# Patient Record
Sex: Male | Born: 1990
Health system: Southern US, Community
[De-identification: ages and names within clinical notes are randomized; demographics above are authoritative.]

## PROBLEM LIST (undated history)

## (undated) DIAGNOSIS — Z8711 Personal history of peptic ulcer disease: Secondary | ICD-10-CM

## (undated) DIAGNOSIS — Z8719 Personal history of other diseases of the digestive system: Secondary | ICD-10-CM

## (undated) DIAGNOSIS — R413 Other amnesia: Secondary | ICD-10-CM

## (undated) DIAGNOSIS — L723 Sebaceous cyst: Secondary | ICD-10-CM

## (undated) DIAGNOSIS — F319 Bipolar disorder, unspecified: Secondary | ICD-10-CM

## (undated) HISTORY — PX: NO PAST SURGERIES: SHX2092

---
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
## 2008-03-29 ENCOUNTER — Ambulatory Visit: Payer: Self-pay | Admitting: Family Medicine

## 2008-05-16 ENCOUNTER — Emergency Department (HOSPITAL_COMMUNITY): Admission: EM | Admit: 2008-05-16 | Discharge: 2008-05-16 | Payer: Self-pay | Admitting: Emergency Medicine

## 2008-08-25 ENCOUNTER — Telehealth: Payer: Self-pay | Admitting: Family Medicine

## 2008-09-19 ENCOUNTER — Ambulatory Visit: Payer: Self-pay | Admitting: Family Medicine

## 2008-09-19 DIAGNOSIS — Z720 Tobacco use: Secondary | ICD-10-CM

## 2008-09-19 DIAGNOSIS — F319 Bipolar disorder, unspecified: Secondary | ICD-10-CM | POA: Insufficient documentation

## 2008-09-19 DIAGNOSIS — H538 Other visual disturbances: Secondary | ICD-10-CM

## 2009-07-02 ENCOUNTER — Ambulatory Visit: Payer: Self-pay | Admitting: Family Medicine

## 2009-07-02 DIAGNOSIS — L708 Other acne: Secondary | ICD-10-CM

## 2009-07-02 DIAGNOSIS — T7840XA Allergy, unspecified, initial encounter: Secondary | ICD-10-CM | POA: Insufficient documentation

## 2009-08-08 ENCOUNTER — Emergency Department (HOSPITAL_COMMUNITY): Admission: EM | Admit: 2009-08-08 | Discharge: 2009-08-09 | Payer: Self-pay | Admitting: Emergency Medicine

## 2010-04-01 ENCOUNTER — Telehealth: Payer: Self-pay | Admitting: Sports Medicine

## 2010-04-24 ENCOUNTER — Ambulatory Visit: Payer: Self-pay | Admitting: Family Medicine

## 2010-04-24 ENCOUNTER — Encounter: Payer: Self-pay | Admitting: Family Medicine

## 2010-04-24 DIAGNOSIS — N342 Other urethritis: Secondary | ICD-10-CM | POA: Insufficient documentation

## 2010-04-24 LAB — CONVERTED CEMR LAB
BUN: 12 mg/dL (ref 6–23)
Calcium: 9.6 mg/dL (ref 8.4–10.5)
Chlamydia, Swab/Urine, PCR: NEGATIVE
Glucose, Bld: 79 mg/dL (ref 70–99)
Hemoglobin: 14.8 g/dL (ref 13.0–17.0)
MCHC: 33.3 g/dL (ref 30.0–36.0)
Nitrite: NEGATIVE
Potassium: 4.4 meq/L (ref 3.5–5.3)
RDW: 14.6 % (ref 11.5–15.5)
Urobilinogen, UA: 0.2
WBC Urine, dipstick: NEGATIVE

## 2010-04-25 ENCOUNTER — Encounter: Payer: Self-pay | Admitting: Family Medicine

## 2010-04-29 ENCOUNTER — Telehealth: Payer: Self-pay | Admitting: Family Medicine

## 2010-05-06 ENCOUNTER — Encounter: Payer: Self-pay | Admitting: Family Medicine

## 2010-06-13 ENCOUNTER — Emergency Department (HOSPITAL_COMMUNITY)
Admission: EM | Admit: 2010-06-13 | Discharge: 2010-06-13 | Payer: Self-pay | Source: Home / Self Care | Admitting: Emergency Medicine

## 2010-06-20 ENCOUNTER — Emergency Department (HOSPITAL_COMMUNITY)
Admission: EM | Admit: 2010-06-20 | Discharge: 2010-06-20 | Payer: Self-pay | Source: Home / Self Care | Admitting: Emergency Medicine

## 2010-06-24 ENCOUNTER — Emergency Department (HOSPITAL_COMMUNITY)
Admission: EM | Admit: 2010-06-24 | Discharge: 2010-06-24 | Payer: Self-pay | Source: Home / Self Care | Admitting: Emergency Medicine

## 2010-06-30 ENCOUNTER — Emergency Department (HOSPITAL_COMMUNITY)
Admission: EM | Admit: 2010-06-30 | Discharge: 2010-06-30 | Payer: Self-pay | Source: Home / Self Care | Admitting: Emergency Medicine

## 2010-07-30 NOTE — Progress Notes (Signed)
Summary: results  Phone Note Call from Patient Call back at Home Phone [phone]   Caller: Patient Summary of Call: Patient requesting results of labs.  Please call back at earliest convenience std results Initial call taken by: Abundio Miu,  April 29, 2010 3:09 PM  Follow-up for Phone Call        Attempted to call patient. Letter already sent with all lab results. Message left advising patient to pass along lab results for CBC, CMET, TSH to psychiatry.  Follow-up by: Bobby Rumpf  MD,  May 01, 2010 2:32 PM

## 2010-07-30 NOTE — Progress Notes (Signed)
Summary: Rx Req  Phone Note Refill Request Call back at [phone] Message from:  MOM-CYNTHIA Aki  Refills Requested: Medication #1:  BENZACLIN WITH PUMP 1-5 % GEL Apply to affected areas on face two times a day. [facility] [facility].  Initial call taken by: Clydell Hakim,  April 01, 2010 9:55 AM  Follow-up for Phone Call        Notified rx ready for pick-up. Follow-up by: Clydell Hakim,  April 01, 2010 2:10 PM    Prescriptions: BENZACLIN WITH PUMP 1-5 % GEL (CLINDAMYCIN PHOS-BENZOYL PEROX) Apply to affected areas on face two times a day  #1 jar x 6   Entered and Authorized by:   Rodney Langton MD   Signed by:   Rodney Langton MD on 04/01/2010   Method used:   Electronically to        [facility] [id number] (retail)       [address] [place], [place]  [address]       Ph: [phone]       Fax: [phone]   RxID:   [id number]

## 2010-07-30 NOTE — Assessment & Plan Note (Signed)
Summary: ck his eye,tcb   Vital Signs:  Patient profile:   20 year old male Height:      72.0 inches Weight:      204.7 pounds BMI:     282.41 Temp:     97.9 degrees F oral Pulse rate:   93 / minute BP sitting:   132 / 77  (left arm) Cuff size:   regular  Vitals Entered By: Gladstone Pih (July 02, 2009 11:18 AM) CC: blurry vision left eye mostly at night Is Patient Diabetic? No Pain Assessment Patient in pain? no       Vision Screening:Left eye w/o correction: 20 / 20 Right Eye w/o correction: 20 / 20 Both eyes w/o correction:  20/ 20        Vision Entered By: Gladstone Pih (July 02, 2009 11:23 AM)   Primary Care Provider:  Ancil Boozer  MD  CC:  blurry vision left eye mostly at night.  History of Present Illness: Eyes:  1 month itchy, watery, both eyes, worse at night, worse when people are cooking in the kitchen, also associated with runny nose.  Some blurriness noted but visual acuity normal.  Had used some of sister's patanol and noted improvement in symptoms.  Referral to ophtho at last visit, has appt in Feb.  No eye pain, no visual field defects, no redness, no HA.  Face:  had an early abscess at last visit, warm compresses helped, still present.  on both cheeks, only mildly painful.  Had been using sister's Elidel without consulting with an MD.  Habits & Providers  Alcohol-Tobacco-Diet     Tobacco Status: current     Tobacco Counseling: to quit use of tobacco products     Cigarette Packs/Day: <0.25  Current Medications (verified): 1)  Lithium Carbonate 300 Mg Caps (Lithium Carbonate) .Marland Kitchen.. 1 By Mouth Qam and 2 By Mouth At Bedtime For Bipolar 2)  Risperidone 2 Mg Tabs (Risperidone) .Marland Kitchen.. 1 By Mouth At Bedtime For Bipolar 3)  Trazodone Hcl 50 Mg Tabs (Trazodone Hcl) .Marland Kitchen.. 1 By Mouth At Bedtime Prn For Insomnia 4)  Depakote Er 500 Mg Xr24h-Tab (Divalproex Sodium) .Marland Kitchen.. 1 By Mouth At Bedtime For Bipolar 5)  Patanol 0.1 % Soln (Olopatadine Hcl) .... 2 Drops  in Each Eye Bid 6)  Loratadine 10 Mg Tabs (Loratadine) .... One Tab By Mouth Daily 7)  Benzaclin With Pump 1-5 % Gel (Clindamycin Phos-Benzoyl Perox) .... Apply To Affected Areas On Face Two Times A Day  Allergies (verified): No Known Drug Allergies  Social History: Smoking Status:  current Packs/Day:  <0.25  Review of Systems       See HPI  Physical Exam  General:  Well-developed,well-nourished,in no acute distress; alert,appropriate and cooperative throughout examination Eyes:  No corneal or conjunctival inflammation noted. EOMI. Perrla. Skin:  Several comedones in various stages of healing noted on face.  No signs cellulitis or abscess at this time, no cervical LAD.     Impression & Recommendations:  Problem # 1:  ALLERGY, ENVIRONMENTAL (ICD-995.3) Assessment New Itchy, watery eyes +/- blurriness likely 2/2 environmental allergies (to ingredients used in cooking).  Will rx patanol as well as oral loratadine.  Pt to let us know if symptoms improve.  If they do then allergic etiology likely.  Advised avoidance/staying out of room when cooking.  Can try other topical agents such as azelastine, topical NSAIDS, or topical vasoconstrictors/decongestants if no improvement.  Keep appt with ophtho.  Orders: [facility]- Est  Level 4 (99214)  Problem # 2:  ACNE VULGARIS, FACIAL (ICD-706.1) Assessment: New Advised to stop using sister's Elidel (pimecrolimus).  Will try the below medication, pt to also avoid close shaves as there is some pseudofolliculitis barbae present.  Can use trimmers as long as they are clean and don't cut close to the skin.  If no improvement can try topical retinoids, then oral agents (minocycline, doxycycline), then oral retinoids if still no improvement.  Also can consider corticosteroid injection into comedones.  His updated medication list for this problem includes:    Benzaclin With Pump 1-5 % Gel (Clindamycin phos-benzoyl perox) .Marland Kitchen... Apply to affected areas on face  two times a day  Orders: [facility]- Est  Level 4 (81191)  Complete Medication List: 1)  Lithium Carbonate 300 Mg Caps (Lithium carbonate) .Marland Kitchen.. 1 by mouth qam and 2 by mouth at bedtime for bipolar 2)  Risperidone 2 Mg Tabs (Risperidone) .Marland Kitchen.. 1 by mouth at bedtime for bipolar 3)  Trazodone Hcl 50 Mg Tabs (Trazodone hcl) .Marland Kitchen.. 1 by mouth at bedtime prn for insomnia 4)  Depakote Er 500 Mg Xr24h-tab (Divalproex sodium) .Marland Kitchen.. 1 by mouth at bedtime for bipolar 5)  Patanol 0.1 % Soln (Olopatadine hcl) .... 2 drops in each eye bid 6)  Loratadine 10 Mg Tabs (Loratadine) .... One tab by mouth daily 7)  Benzaclin With Pump 1-5 % Gel (Clindamycin phos-benzoyl perox) .... Apply to affected areas on face two times a day  Other Orders: Influenza Vaccine NON MCR ([address])  Patient Instructions: 1)  Good to meet you today, 2)  use the patanol two times a day, use the loratadine daily, apply the Benzaclin two times a day. 3)  Keep your appt with the eye doctor. 4)  -Dr. Karie Schwalbe. Prescriptions: BENZACLIN WITH PUMP 1-5 % GEL (CLINDAMYCIN PHOS-BENZOYL PEROX) Apply to affected areas on face two times a day  #1 jar x 0   Entered and Authorized by:   Rodney Langton MD   Signed by:   Rodney Langton MD on 07/02/2009   Method used:   Electronically to        [facility] [address] (retail)       [address]       [place], [place]  [address]       Ph: [phone]       Fax: [phone]   RxID:   [id number] LORATADINE 10 MG TABS (LORATADINE) One tab by mouth daily  #90 x 0   Entered and Authorized by:   Rodney Langton MD   Signed by:   Rodney Langton MD on 07/02/2009   Method used:   Electronically to        [facility] [address] (retail)       [address] [place], [place]  [address]       Ph: [phone]       Fax: [phone]   RxID:   [id number] PATANOL 0.1 % SOLN (OLOPATADINE HCL) 2 drops in each eye BID  #1 bottle x 0    Entered and Authorized by:   Rodney Langton MD   Signed by:   Rodney Langton MD on 07/02/2009   Method used:   Electronically to        [facility] [address] (retail)       [address]       [place], [place]  [address]       Ph: [phone]       Fax: [phone]   RxID:   [id number]       Immunizations Administered:  Influenza Vaccine # 1:    Vaccine Type: Fluvax Non-MCR    Site: left deltoid    Mfr: GlaxoSmithKline    Dose: 0.5 ml    Route: IM    Given by: Loralee Pacas CMA    Exp. Date: 12/27/2009    Lot #: [id number]    VIS given: 04/06/2009  Flu Vaccine Consent Questions:    Do you have a history of severe allergic reactions to this vaccine? no    Any prior history of allergic reactions to egg and/or gelatin? no    Do you have a sensitivity to the preservative Thimersol? no    Do you have a past history of Guillan-Barre Syndrome? no    Do you currently have an acute febrile illness? no    Have you ever had a severe reaction to latex? no    Vaccine information given and explained to patient? yes

## 2010-07-30 NOTE — Assessment & Plan Note (Signed)
Summary: cpe/eo   Vital Signs:  Patient profile:   20 year old male Height:      73 inches Weight:      201 pounds BMI:     26.61 Temp:     98.7 degrees F oral Pulse rate:   90 / minute BP sitting:   149 / 79  (right arm) Cuff size:   large  Vitals Entered By: Jimmy Footman, CMA (April 24, 2010 10:16 AM) CC: STD testing  Is Patient Diabetic? No Pain Assessment Patient in pain? no        Primary Care Provider:  . [facility]  CC:  STD testing .  History of Present Illness: 1) STD testing: Unprotected intercourse 2 weeks ago - now notes some intermittent tingling sensation and dysuria. Would like to be tested for STDs.   2) Bipolar disorder: On medications as below without side effects. Reports that he is seen twice a month at [facility] Reports that he is supposed to have lab work done, but has not been checked with  lab work for about a year. Reports that his mood is "stable". Denies SI/HI.  ROS: Denies weight loss, fever, chills, lesions, lymphadenopathy, cough, joint pain, rash, oral lesions, nausea or vomiting, hematuria, increased or decreased urinary frequency, myalgias  Habits & Providers  Alcohol-Tobacco-Diet     Tobacco Status: current  Exercise-Depression-Behavior     Does Patient Exercise: yes     Have you felt down or hopeless? no     Have you felt little pleasure in things? no     Depression Counseling: not indicated; screening negative for depression  Current Medications (verified): 1)  Lithium Carbonate 300 Mg Caps (Lithium Carbonate) .Marland Kitchen.. 1 By Mouth Qam and 2 By Mouth At Bedtime For Bipolar 2)  Risperidone 2 Mg Tabs (Risperidone) .Marland Kitchen.. 1 By Mouth At Bedtime For Bipolar 3)  Trazodone Hcl 50 Mg Tabs (Trazodone Hcl) .Marland Kitchen.. 1 By Mouth At Bedtime Prn For Insomnia 4)  Depakote Er 500 Mg Xr24h-Tab (Divalproex Sodium) .Marland Kitchen.. 1 By Mouth At Bedtime For Bipolar 5)  Patanol 0.1 % Soln (Olopatadine Hcl) .... 2 Drops in Each Eye Bid 6)  Loratadine 10 Mg Tabs  (Loratadine) .... One Tab By Mouth Daily 7)  Benzaclin With Pump 1-5 % Gel (Clindamycin Phos-Benzoyl Perox) .... Apply To Affected Areas On Face Two Times A Day  Allergies (verified): No Known Drug Allergies  Social History: Does Patient Exercise:  yes  Physical Exam  General:  Well-developed,well-nourished,in no acute distress; alert,appropriate and cooperative throughout examination Mouth:  no oral lesions  Neck:  no thyromegaly  Lungs:  normal respiratory effort, normal breath sounds, and no wheezes.   Heart:  normal rate, regular rhythm, and no murmur.   Genitalia:  uncircumcised, no scrotal masses, no testicular masses or atrophy, no cutaneous lesions, and no urethral discharge.   Inguinal Nodes:  no R inguinal adenopathy and no L inguinal adenopathy.     Impression & Recommendations:  Problem # 1:  URETHRITIS (ICD-597.80) Normal exam. UA negative. Will check GC/CT (urine).  Orders: Urinalysis-[facility] (00000) GC/Chlamydia-[facility] (87591/87491) [facility]- Est  Level 4 (99214)  Problem # 2:  CONTACT OR EXPOSURE TO OTHER VIRAL DISEASES (ICD-V01.79) No symptoms. Will check HIV, RPR. Advised on safe sexual practices.  Orders: HIV-[facility] (47425-95638) [facility]- Est  Level 4 (75643)  Problem # 3:  BIPOLAR AFFECTIVE DISORDER (ICD-296.80) Assessment: Unchanged Will check BMET and CBC with chronic lithium therapy and reports of no labs in past year.  Unable to check trough as pateint took medications this AM. Plan in place by [facility] for patient to have lab check as well (per his report) "soon". Will forward lab results when available. Stable mood. No SI/HI.   Orders: Basic Met-[facility] (59563-87564) CBC-[facility] (33295) [facility]- Est  Level 4 (18841)  Complete Medication List: 1)  Lithium Carbonate 300 Mg Caps (Lithium carbonate) .Marland Kitchen.. 1 by mouth qam and 2 by mouth at bedtime for bipolar 2)  Risperidone 2 Mg Tabs (Risperidone) .Marland Kitchen.. 1 by mouth at bedtime for bipolar 3)  Trazodone Hcl 50 Mg Tabs  (Trazodone hcl) .Marland Kitchen.. 1 by mouth at bedtime prn for insomnia 4)  Depakote Er 500 Mg Xr24h-tab (Divalproex sodium) .Marland Kitchen.. 1 by mouth at bedtime for bipolar 5)  Patanol 0.1 % Soln (Olopatadine hcl) .... 2 drops in each eye bid 6)  Loratadine 10 Mg Tabs (Loratadine) .... One tab by mouth daily 7)  Benzaclin With Pump 1-5 % Gel (Clindamycin phos-benzoyl perox) .... Apply to affected areas on face two times a day  Other Orders: Influenza Vaccine NON MCR (66063) RPR-[facility] [phone])  Patient Instructions: 1)  Use protection when you have sexual intercourse 2)  Follow up with your psychiatrist - I am going to check lab work on you today and will mail a copy of it to give to them    Orders Added: 1)  Basic Met-[facility] [55732-20254] 2)  CBC-[facility] [85027] 3)  Urinalysis-[facility] [00000] 4)  GC/Chlamydia-[facility] [87591/87491] 5)  HIV-[facility] [27062-37628] 6)  Influenza Vaccine NON MCR [00028] 7)  RPR-[facility] [31517-61607] 8)  [facility]- Est  Level 4 [37106]   Immunizations Administered:  Influenza Vaccine # 1:    Vaccine Type: Fluvax Non-MCR    Site: left deltoid    Mfr: GlaxoSmithKline    Dose: 0.5 ml    Route: IM    Given by: Tessie Fass CMA    Exp. Date: 12/25/2010    Lot #: [id number]    VIS given: 01/22/10 version given April 24, 2010.  Flu Vaccine Consent Questions:    Do you have a history of severe allergic reactions to this vaccine? no    Any prior history of allergic reactions to egg and/or gelatin? no    Do you have a sensitivity to the preservative Thimersol? no    Do you have a past history of Guillan-Barre Syndrome? no    Do you currently have an acute febrile illness? no    Have you ever had a severe reaction to latex? no    Vaccine information given and explained to patient? yes   Immunizations Administered:  Influenza Vaccine # 1:    Vaccine Type: Fluvax Non-MCR    Site: left deltoid    Mfr: GlaxoSmithKline    Dose: 0.5 ml    Route: IM    Given by: Tessie Fass CMA    Exp. Date:  12/25/2010    Lot #: [id number]    VIS given: 01/22/10 version given April 24, 2010.  Laboratory Results   Urine Tests  Date/Time Received: April 24, 2010 10:51 AM  Date/Time Reported: April 24, 2010 11:20 AM   Routine Urinalysis   Color: yellow Appearance: Clear Glucose: negative   (Normal Range: Negative) Bilirubin: negative   (Normal Range: Negative) Ketone: negative   (Normal Range: Negative) Spec. Gravity: 1.015   (Normal Range: 1.003-1.035) Blood: negative   (Normal Range: Negative) pH: 7.0   (Normal Range: 5.0-8.0) Protein: negative   (Normal Range: Negative) Urobilinogen: 0.2   (Normal Range:  0-1) Nitrite: negative   (Normal Range: Negative) Leukocyte Esterace: negative   (Normal Range: Negative)    Comments: .........Marland Kitchenbiochemical negative; microscopic not indicated ...............test performed by......Marland KitchenBonnie A. [place], MLS (ASCP)cm      Appended Document: Orders Update    Clinical Lists Changes  Orders: Added new Test order of TSH-[facility] [phone]) - Signed

## 2010-07-30 NOTE — Miscellaneous (Signed)
Summary: ROI  ROI   Imported By: Knox Royalty 05/15/2010 10:17:07  _____________________________________________________________________  External Attachment:    Type:   Image     Comment:   External Document

## 2010-09-09 LAB — URINALYSIS, ROUTINE W REFLEX MICROSCOPIC
Hgb urine dipstick: NEGATIVE
Specific Gravity, Urine: 1.025 (ref 1.005–1.030)
Urobilinogen, UA: 1 mg/dL (ref 0.0–1.0)
pH: 6 (ref 5.0–8.0)

## 2010-09-09 LAB — URINE MICROSCOPIC-ADD ON

## 2010-09-09 LAB — GC/CHLAMYDIA PROBE AMP, GENITAL: Chlamydia, DNA Probe: NEGATIVE
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
## 2010-11-15 NOTE — Discharge Summary (Signed)
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  The patient was  dropped off at the front lobby with no adult accompanying him.  The  family attributes the patient's 3 weeks of increasing paranoid delusions  to tainted cannabis in the community.  The patient suggests he has used  cannabis for several years and that he has not smoked again for the left  3 weeks.  The patient describes longstanding and more recent mental  health issues but he is not clear or decisive in his reporting as though  he is paranoid and depressively and anxiously inhibited.  For full  details please see the typed admission assessment.   SYNOPSIS OF PRESENT ILLNESS:  The patient resides with mother, sister  and brother with mother apparently having mental health difficulties  that she does not discuss with any of the family.  Mother therefore  maintains that the rest of the family does not know about her problems,  but the patient indicates that he has been attempting to talk to mother  about her symptoms and the meaning for his symptoms all of his life.  Mother feels that part of the patient's behavior is genetic and there is  a cousin that died at age 61 of a drug overdose to whom the patient was  close.  The patient is artistic but currently finding difficulty  functioning in his academic productivity.  He is becoming more stressed  and depressed.   Mother considers herself to have anxiety and  schizophrenia and bipolar disorder treated with Abilify and Depakote.  Maternal uncle has bipolar depression and maternal grandmother's on  Xanax for anxiety.  Two cousins have anger management problems and a  maternal uncle has ADHD and anxiety.  Maternal great-grandmother had  anxiety and sister has learning this disorder.  There is some family  history of substance abuse especially alcohol and some drugs.  The  patient is fixated on his bowels and bladder at the time of admission  but will not explain his symptoms other than saying something is wrong.  He suggests that he has burning pain in his chest to as well.  He seems  to indicate vague auditory hallucinations but seems constricted by more  chronic persecutory delusions including regarding communication and  relations with mother.  The father figure in his life is more confident  and provides more security for the patient.  His history of cardiac  murmur was apparently some type of septal defect that resolved, although  one of his chest x-rays was labeled as hypertrophic cardiomyopathy.  The  patient acknowledges sexual activity.  He was one of the candidates for  homecoming king.  Mother suggests that he is quite popular at school  with the patient stating he uses cannabis to fit in with peer pressure.   INITIAL MENTAL STATUS EXAM:  Neurological exam was intact and he is  right-handed.  He is hesitant and inhibited even to talk about his  worries, perceptual problems and depression.  He has severe dysphoria  with melancholic involution.  He has persecutory delusions that are  difficult to fully outline but did not appear readily fixed although he  will not discuss them.  He appears to have projective identification of  symptoms from mother as well as shared symptoms.  He is disorganized and  eccentric at times although well liked and accepted by peers.  He has  suicidal ideation as  though thinking he may be the cause of mother's  problems.   LABORATORY FINDINGS:  CBC was normal with white count 4100, hemoglobin  15.4, MCV of 81 and platelet count 369,000.  Basic metabolic panel was  normal with sodium 135, potassium 4.4, fasting glucose 90, creatinine  1.0, and calcium 10.2.  Hepatic function panel was normal with total  bilirubin 0.8, albumin 4.1, AST 16, ALT 21 and GGT 21.  Free T4 was  normal at 1.18 and TSH at 2.226.  Urinalysis was normal with specific  gravity of 1.025 and pH of one.  Urine drug screen was negative with  creatinine of 259 mg/dL documenting adequate specimen.  RPR was  nonreactive and urine probe for gonorrhea and chlamydia by DNA  amplification were both negative.  Electrocardiogram performed on 5 mg  of Abilify nightly with p.r.n. of 2 mg available was normal sinus rhythm  with sinus arrhythmia, normal EKG with rate of 71, PR of 150, QRS of 84  and QTC 393 milliseconds.  This was not yet confirmed by cardiology.   HOSPITAL COURSE AND TREATMENT:  General medical exam by Jorje Guild PA-C  noted that he may have had hospitalization for kidney problems within  the last year.  The patient seemed to be describing constipation as a  source of his abdominal and genital as well as chest complaints.  He has  no medication allergies.  The patient does have sleep difficulties and  thinks he has lost weight.  He has frequent crying spells.  He reports  sexual activity.  His exam was otherwise intact.  He received Colace 100  mg b.i.d. throughout the hospital stay.  His perseverative fixation upon  something wrong in the bowels or genitals gradually resided.  He was  afebrile throughout the hospital stay with maximum temperature 98.3.  Initial supine blood pressure was 123/70 with heart rate of 63 and  standing blood pressure 120/75 with heart rate of 86.  At the time of  discharge, supine blood pressure was 103/59 with heart rate of 73 and  standing blood  pressure 102/68 with heart rate of 65.  The patient's  Abilify was gradually set using titrated h.s. dose as well as p.r.n.s.  He ultimately had a few side effects with most efficacy on 7.5 mg every  bedtime.  He did require Ambien at sleep 10 mg.  He had no  extrapyramidal side effects or other specific side effects.  He may have  been too drowsy on 10 mg of Abilify at bedtime.  The patient was  hesitant about asking for his Ambien on a p.r.n. basis.  By the time of  discharge he was taking both Abilify and Ambien on a scheduled basis..  The patient had no extrapyramidal or akathisia-type side effects.  He  had no side effects from Ambien including no suicide related side  effects.  Over the clinical course of treatment, the best explanation  for his longstanding and more acute symptoms appeared to be the  possibility of a delusional disorder, complicating generalized anxiety  and now psychotic depression.  The patient required no seclusion or  restraint during hospital stay.   FINAL DIAGNOSES:  AXIS I:  1. Major depression single episode severe with melancholic and      psychotic features.  2. Generalized anxiety disorder.  3. Rule out delusional disorder, persecutory type (provisional      diagnosis).  4. Cannabis abuse.  5. Parent child problem.  6. Other specified family circumstances.  7. Other interpersonal problem  AXIS II:  1. Rule out schizotypal personality (provisional diagnosis).  AXIS III:  1. Constipation.  2. History of cardiac murmur apparently septal defect that closed  3. Cannabis smoking  AXIS IV: Stressors family severe acute and chronic; phase of life severe  acute and chronic; school moderate acute and chronic  AXIS V: GAF on admission was 29 with highest in last year 55 and  discharge GAF was 50.   PLAN:  In discussing the options for Celexa during the hospital course,  we maintained Abilify.  He follows a regular diet and has no  restrictions on  physical activity.  Crisis and safety plans are outlined  if needed.  He requires no wound care or pain management.  He is  discharged on the following  1. Abilify 15 mg tablets take 1/2 tablet or 7.5 mg every bedtime      quantity #15 prescribed.  2. Ambien 10 mg every bedtime quantity #30 with no refill prescribed.      They are educated on medication      including FDA guidelines and warnings.  The patient will see Lewis Moccasin Ph.D. August 26, 2007 at 11:30 for therapy at [phone].      He will see Dr. Yvetta Coder at [phone] on February 24 at 11:30 for      psychiatric follow-up.  He has no auditory hallucinations,      homicidal ideation or suicidal ideation at the time of discharge.      Lalla Brothers, MD  Electronically Signed     GEJ/MEDQ  D:  08/27/2007  T:  08/28/2007  Job:  [phone]

## 2011-03-21 LAB — GC/CHLAMYDIA PROBE AMP, URINE
Chlamydia, Swab/Urine, PCR: NEGATIVE
GC Probe Amp, Urine: NEGATIVE

## 2011-03-21 LAB — HEPATIC FUNCTION PANEL
ALT: 21
Albumin: 4.1
Alkaline Phosphatase: 73
Total Protein: 7.2

## 2011-03-21 LAB — URINALYSIS, ROUTINE W REFLEX MICROSCOPIC
Glucose, UA: NEGATIVE
Ketones, ur: NEGATIVE
pH: 6

## 2011-03-21 LAB — CBC
Hemoglobin: 15.4
RBC: 5.71
WBC: 4.1

## 2011-03-21 LAB — DIFFERENTIAL
Lymphocytes Relative: 33
Lymphs Abs: 1.4
Monocytes Absolute: 0.2
Monocytes Relative: 5
Neutro Abs: 2.4

## 2011-03-21 LAB — BASIC METABOLIC PANEL
CO2: 26
Calcium: 10.2
GFR calc Af Amer: >60
GFR calc non Af Amer: >60
Sodium: 135

## 2011-03-21 LAB — DRUGS OF ABUSE SCREEN W/O ALC, ROUTINE URINE
Amphetamine Screen, Ur: NEGATIVE
Barbiturate Quant, Ur: NEGATIVE
Phencyclidine (PCP): NEGATIVE

## 2011-03-21 LAB — GAMMA GT: GGT: 21

## 2011-04-01 LAB — CBC
Hemoglobin: 17.4 — ABNORMAL HIGH
MCHC: 32.2
MCV: 82.8
RBC: 6.5 — ABNORMAL HIGH
WBC: 3.9 — ABNORMAL LOW

## 2011-04-01 LAB — RAPID URINE DRUG SCREEN, HOSP PERFORMED
Barbiturates: NOT DETECTED
Benzodiazepines: NOT DETECTED

## 2011-04-01 LAB — COMPREHENSIVE METABOLIC PANEL
ALT: 19
AST: 19
CO2: 25
Calcium: 10.3
Chloride: 101
GFR calc non Af Amer: >60
Glucose, Bld: 97
Sodium: 139
Total Bilirubin: 2 — ABNORMAL HIGH

## 2011-04-01 LAB — DIFFERENTIAL
Basophils Absolute: 0.1
Basophils Relative: 2 — ABNORMAL HIGH
Eosinophils Absolute: 0.1
Eosinophils Relative: 2
Lymphs Abs: 1.2
Neutrophils Relative %: 57

## 2011-04-01 LAB — ETHANOL: Alcohol, Ethyl (B): <5

## 2012-06-01 ENCOUNTER — Ambulatory Visit: Payer: Self-pay

## 2012-06-28 ENCOUNTER — Emergency Department (HOSPITAL_COMMUNITY)
Admission: EM | Admit: 2012-06-28 | Discharge: 2012-06-28 | Disposition: A | Discharge status: Home / Self Care | Payer: Self-pay | Source: Home / Self Care | Attending: Emergency Medicine | Admitting: Emergency Medicine

## 2012-06-28 ENCOUNTER — Encounter (HOSPITAL_COMMUNITY): Payer: Self-pay | Admitting: Emergency Medicine

## 2012-06-28 DIAGNOSIS — S335XXA Sprain of ligaments of lumbar spine, initial encounter: Secondary | ICD-10-CM

## 2012-06-28 DIAGNOSIS — S39012A Strain of muscle, fascia and tendon of lower back, initial encounter: Secondary | ICD-10-CM

## 2012-06-28 HISTORY — DX: Bipolar disorder, unspecified: F31.9

## 2012-06-28 LAB — POCT URINALYSIS DIP (DEVICE)
Bilirubin Urine: NEGATIVE
Glucose, UA: NEGATIVE mg/dL
Hgb urine dipstick: NEGATIVE
Ketones, ur: NEGATIVE mg/dL
Nitrite: NEGATIVE
Specific Gravity, Urine: 1.01 (ref 1.005–1.030)
pH: 7.5 (ref 5.0–8.0)

## 2012-06-28 MED ORDER — TRAMADOL HCL 50 MG PO TABS: 100.0000 mg | ORAL_TABLET | Freq: Three times a day (TID) | ORAL | Status: DC | PRN

## 2012-06-28 MED ORDER — METHOCARBAMOL 500 MG PO TABS: 500.0000 mg | ORAL_TABLET | Freq: Three times a day (TID) | ORAL | Status: DC

## 2012-06-28 MED ORDER — MELOXICAM 15 MG PO TABS: 15.0000 mg | ORAL_TABLET | Freq: Every day | ORAL | Status: DC

## 2012-06-28 NOTE — ED Notes (Signed)
Pt c/o lower back pain x1.5 weeks... Recalls lifting a chair that weighed about 200 lbs and since then his back has been hurting... Hurts to standup and bend over... Denies: urinary prob, fevers, vomiting, nauseas, diarrhea.Marland Kitchen He is alert w/no signs of acute distress.

## 2012-06-28 NOTE — ED Provider Notes (Signed)
Chief Complaint  Patient presents with  . Back Pain    History of Present Illness:   Ryne is a 21 year old male who has had a ten-day history of left lower back pain which came on after he lifted a motorized wheelchair into a truck. The pain is rated 8-9 or 10 in intensity. It does not radiate down his leg and there is no numbness, tingling, or muscle weakness. He denies any dysuria, frequency, hematuria, incontinence of urine, incontinence of stool, or saddle anesthesia. There is no abdominal pain, fever, chills, headache, stiff neck, or weight loss. The pain is worse if he stands or bends. Nothing particular makes it better. He takes Abilify for bipolar disorder. He has no prior history of back problems.  Review of Systems:  Other than noted above, the patient denies any of the following symptoms: Systemic:  No fever, chills, severe fatigue, or unexplained weight loss. GI:  No abdominal pain, nausea, vomiting, diarrhea, constipation, incontinence of bowel, or blood in stool. GU:  No dysuria, frequency, urgency, or hematuria. No incontinence of urine or difficulty urinating.  M-S:  No neck pain, joint pain, arthritis, or myalgias. Neuro:  No paresthesias, saddle anesthesia, muscular weakness, or progressive neurological deficit.  PMFSH:  Past medical history, family history, social history, meds, and allergies were reviewed. Specifically, there is no history of cancer, major trauma, osteoporosis, immunosuppression, HIV, or IV or injection drug use.   Physical Exam:   Vital signs:  BP 127/90  Pulse 80  Temp 98.4 F (36.9 C) (Oral)  Resp 18  SpO2 98% General:  Alert, oriented, in no distress. Abdomen:  Soft, non-tender.  No organomegaly or mass.  No pulsatile midline abdominal mass or bruit. Back:  There is pain to palpation over the left SI area. His back has a fairly good range of motion with pain in all directions. Straight leg raising is negative. Neuro:  Normal muscle strength,  sensations and DTRs. Extremities: Pedal pulses were full, there was no edema. Skin:  Clear, warm and dry.  No rash.  Labs:   Results for orders placed during the hospital encounter of 06/28/12  POCT URINALYSIS DIP (DEVICE)      Component Value Range   Glucose, UA NEGATIVE  NEGATIVE mg/dL   Bilirubin Urine NEGATIVE  NEGATIVE   Ketones, ur NEGATIVE  NEGATIVE mg/dL   Specific Gravity, Urine 1.010  1.005 - 1.030   Hgb urine dipstick NEGATIVE  NEGATIVE   pH 7.5  5.0 - 8.0   Protein, ur NEGATIVE  NEGATIVE mg/dL   Urobilinogen, UA 0.2  0.0 - 1.0 mg/dL   Nitrite NEGATIVE  NEGATIVE   Leukocytes, UA NEGATIVE  NEGATIVE     Assessment:  The encounter diagnosis was Lumbar strain.  Plan:   1.  The following meds were prescribed:   New Prescriptions   MELOXICAM (MOBIC) 15 MG TABLET    Take 1 tablet (15 mg total) by mouth daily.   METHOCARBAMOL (ROBAXIN) 500 MG TABLET    Take 1 tablet (500 mg total) by mouth 3 (three) times daily.   TRAMADOL (ULTRAM) 50 MG TABLET    Take 2 tablets (100 mg total) by mouth every 8 (eight) hours as needed for pain.   2.  The patient was instructed in symptomatic care and handouts were given. 3.  The patient was told to return if becoming worse in any way, if no better in 2 weeks, and given some red flag symptoms that would indicate earlier return. 4.  The patient was encouraged to try to be as active as possible and given some exercises to do followed by moist heat.    Reuben Likes, MD 06/28/12 2118

## 2013-08-01 ENCOUNTER — Ambulatory Visit (INDEPENDENT_AMBULATORY_CARE_PROVIDER_SITE_OTHER): Payer: Medicare Other | Admitting: Family Medicine

## 2013-08-01 ENCOUNTER — Encounter: Payer: Self-pay | Admitting: Family Medicine

## 2013-08-01 VITALS — BP 125/89 | HR 89 | Temp 98.7°F | Ht 72.5 in | Wt 197.0 lb

## 2013-08-01 DIAGNOSIS — F319 Bipolar disorder, unspecified: Secondary | ICD-10-CM

## 2013-08-01 DIAGNOSIS — F17200 Nicotine dependence, unspecified, uncomplicated: Secondary | ICD-10-CM | POA: Diagnosis not present

## 2013-08-01 DIAGNOSIS — S335XXA Sprain of ligaments of lumbar spine, initial encounter: Secondary | ICD-10-CM | POA: Diagnosis not present

## 2013-08-01 MED ORDER — NICOTINE 10 MG IN INHA: 2.0000 | RESPIRATORY_TRACT | Status: DC | PRN

## 2013-08-01 MED ORDER — NICOTINE 10 MG IN INHA: RESPIRATORY_TRACT | Status: DC

## 2013-08-01 NOTE — Patient Instructions (Signed)
It was great to meet you today!  Come back in 1 month for follow up for smoking cessation  Use the inhaler 5-10 times daily, change cartridges after noon.

## 2013-08-01 NOTE — Assessment & Plan Note (Signed)
[facility] Clearly takes more meds than are listed but he cant remember them all Will bring bottles to next appt.

## 2013-08-01 NOTE — Assessment & Plan Note (Signed)
Wants to quits Prescribed and discussed use of nicotrol inhaler as he needs something to do with his hands as well.  Follow up in 1 month and plan to decrease the number of cartridges he uses daily.

## 2013-08-01 NOTE — Progress Notes (Signed)
Patient ID: [name] [name], male   DOB: 29-Jun-1991, 23 y.o.   MRN: [id number]  [name] FentonSamuel Bradshaw, MD Phone: [phone]  Subjective:  Chief complaint-noted  Patient here to establish care  Denies any symptoms or problems at this time. I reviewed his medical, surgical, family, and social history in detail and updated to relevant portions of EMR  Tobacco abuse States that he smokes 5-8 cigarettes daily for several years now. At 5 minutes in the morning before he smokes his first cigarette. He says that the most important part is having some into his hands.  He has a past psychiatric history of bipolar 1 disorder and paranoid schizophrenia, he [facility] these conditions [facility].  ROS- No fever, chills, sweats No chest pain No dyspnea No abdominal pain No dysuria or penile discharge   Past Medical History Patient Active Problem List   Diagnosis Date Noted  . BIPOLAR AFFECTIVE DISORDER 09/19/2008  . Tobacco abuse 09/19/2008  . OTHER SPECIFIED VISUAL DISTURBANCES 09/19/2008    Medications- reviewed and updated Current Outpatient Prescriptions  Medication Sig Dispense Refill  . ARIPiprazole (ABILIFY) 10 MG tablet Take 10 mg by mouth daily.      . meloxicam (MOBIC) 15 MG tablet Take 1 tablet (15 mg total) by mouth daily.  15 tablet  0  . methocarbamol (ROBAXIN) 500 MG tablet Take 1 tablet (500 mg total) by mouth 3 (three) times daily.  30 tablet  0  . nicotine (NICOTROL) 10 MG inhaler Use 1 cartridge 5-6 times daily, change cartridges at noon  168 each  1  . traMADol (ULTRAM) 50 MG tablet Take 2 tablets (100 mg total) by mouth every 8 (eight) hours as needed for pain.  30 tablet  0   No current facility-administered medications for this visit.    Objective: BP 125/89  Pulse 89  Temp(Src) 98.7 F (37.1 C) (Oral)  Ht 6' 0.5" (1.842 m)  Wt 197 lb (89.359 kg)  BMI 26.34 kg/m2 Gen: NAD, alert, cooperative with exam HEENT: NCAT CV: RRR, good S1/S2, no murmur Resp:  CTABL, no wheezes, non-labored Abd: SNTND, BS present, no guarding or organomegaly Ext: No edema, warm Neuro: Alert and oriented, No gross deficits   Assessment/Plan:  BIPOLAR AFFECTIVE DISORDER Follows with [facility] Clearly takes more meds than are listed but he cant remember them all Will bring bottles to next appt.   Tobacco abuse Wants to quits Prescribed and discussed use of nicotrol inhaler as he needs something to do with his hands as well.  Follow up in 1 month and plan to decrease the number of cartridges he uses daily.     No orders of the defined types were placed in this encounter.    Meds ordered this encounter  Medications  . DISCONTD: nicotine (NICOTROL) 10 MG inhaler    Sig: Inhale 2 cartridges (2 continuous puffing total) into the lungs as needed for smoking cessation.    Dispense:  168 each    Refill:  1  . nicotine (NICOTROL) 10 MG inhaler    Sig: Use 1 cartridge 5-6 times daily, change cartridges at noon    Dispense:  168 each    Refill:  1

## 2013-08-04 ENCOUNTER — Telehealth: Payer: Self-pay | Admitting: *Deleted

## 2013-08-04 NOTE — Telephone Encounter (Signed)
[facility]rol INH 10 mg. Plan does not cover medication. Formulary and PA form placed in provider box for completion or call/fax pharmacy to change medication.  Clovis PuMartin, Moxie Kalil L, RN

## 2013-08-08 NOTE — Telephone Encounter (Signed)
Called patient to discuss prior auth for nicotine inhalers. He has never tried nicotine gum or patches. I explained that he should try the gum for now and we can follow up. If he fails gum and patches then We will attempt prior-auth for the inhalers.   This is unfortunate as he would have good health benefit from smoking cessation, will follow up as planned.   Murtis SinkSam Garl Speigner, MD [facility] Resident, PGY-2 08/08/2013, 8:37 AM

## 2013-09-23 DIAGNOSIS — F259 Schizoaffective disorder, unspecified: Secondary | ICD-10-CM | POA: Diagnosis not present

## 2013-12-09 DIAGNOSIS — F259 Schizoaffective disorder, unspecified: Secondary | ICD-10-CM | POA: Diagnosis not present

## 2014-03-20 ENCOUNTER — Ambulatory Visit (INDEPENDENT_AMBULATORY_CARE_PROVIDER_SITE_OTHER): Payer: Medicare Other | Admitting: *Deleted

## 2014-03-20 DIAGNOSIS — Z23 Encounter for immunization: Secondary | ICD-10-CM | POA: Diagnosis not present

## 2014-03-20 DIAGNOSIS — F319 Bipolar disorder, unspecified: Secondary | ICD-10-CM | POA: Diagnosis not present

## 2014-03-20 DIAGNOSIS — F17200 Nicotine dependence, unspecified, uncomplicated: Secondary | ICD-10-CM | POA: Diagnosis not present

## 2014-03-20 DIAGNOSIS — S335XXA Sprain of ligaments of lumbar spine, initial encounter: Secondary | ICD-10-CM | POA: Diagnosis not present

## 2014-03-24 DIAGNOSIS — F259 Schizoaffective disorder, unspecified: Secondary | ICD-10-CM | POA: Diagnosis not present

## 2014-07-27 DIAGNOSIS — F250 Schizoaffective disorder, bipolar type: Secondary | ICD-10-CM | POA: Diagnosis not present

## 2014-08-23 ENCOUNTER — Encounter (HOSPITAL_COMMUNITY): Payer: Self-pay | Admitting: Emergency Medicine

## 2014-08-23 ENCOUNTER — Emergency Department (HOSPITAL_COMMUNITY)
Admission: EM | Admit: 2014-08-23 | Discharge: 2014-08-25 | Disposition: A | Discharge status: Home / Self Care | Payer: Medicare Other | Attending: Emergency Medicine | Admitting: Emergency Medicine

## 2014-08-23 DIAGNOSIS — Z79899 Other long term (current) drug therapy: Secondary | ICD-10-CM | POA: Insufficient documentation

## 2014-08-23 DIAGNOSIS — R4689 Other symptoms and signs involving appearance and behavior: Secondary | ICD-10-CM | POA: Diagnosis present

## 2014-08-23 DIAGNOSIS — F3162 Bipolar disorder, current episode mixed, moderate: Secondary | ICD-10-CM | POA: Insufficient documentation

## 2014-08-23 DIAGNOSIS — R456 Violent behavior: Secondary | ICD-10-CM | POA: Diagnosis not present

## 2014-08-23 DIAGNOSIS — F919 Conduct disorder, unspecified: Secondary | ICD-10-CM | POA: Insufficient documentation

## 2014-08-23 DIAGNOSIS — Z791 Long term (current) use of non-steroidal anti-inflammatories (NSAID): Secondary | ICD-10-CM | POA: Insufficient documentation

## 2014-08-23 DIAGNOSIS — Z046 Encounter for general psychiatric examination, requested by authority: Secondary | ICD-10-CM | POA: Diagnosis present

## 2014-08-23 DIAGNOSIS — F319 Bipolar disorder, unspecified: Secondary | ICD-10-CM | POA: Diagnosis not present

## 2014-08-23 DIAGNOSIS — F3110 Bipolar disorder, current episode manic without psychotic features, unspecified: Secondary | ICD-10-CM | POA: Diagnosis not present

## 2014-08-23 LAB — CBC
HCT: 48.8 % (ref 39.0–52.0)
HEMOGLOBIN: 16.1 g/dL (ref 13.0–17.0)
MCH: 27.5 pg (ref 26.0–34.0)
MCHC: 33 g/dL (ref 30.0–36.0)
MCV: 83.3 fL (ref 78.0–100.0)
Platelets: 206 10*3/uL (ref 150–400)
RBC: 5.86 MIL/uL — AB (ref 4.22–5.81)
RDW: 13.6 % (ref 11.5–15.5)
WBC: 6.6 10*3/uL (ref 4.0–10.5)

## 2014-08-23 LAB — COMPREHENSIVE METABOLIC PANEL WITH GFR
ALT: 34 U/L (ref 0–53)
AST: 60 U/L — ABNORMAL HIGH (ref 0–37)
Albumin: 4.9 g/dL (ref 3.5–5.2)
Alkaline Phosphatase: 71 U/L (ref 39–117)
Anion gap: 10 (ref 5–15)
BUN: 11 mg/dL (ref 6–23)
CO2: 24 mmol/L (ref 19–32)
Calcium: 9.5 mg/dL (ref 8.4–10.5)
Chloride: 106 mmol/L (ref 96–112)
Creatinine, Ser: 1.42 mg/dL — ABNORMAL HIGH (ref 0.50–1.35)
GFR calc Af Amer: 78 mL/min — ABNORMAL LOW (ref >90)
GFR calc non Af Amer: 68 mL/min — ABNORMAL LOW (ref >90)
Glucose, Bld: 93 mg/dL (ref 70–99)
Potassium: 3.4 mmol/L — ABNORMAL LOW (ref 3.5–5.1)
Sodium: 140 mmol/L (ref 135–145)
Total Bilirubin: 1.4 mg/dL — ABNORMAL HIGH (ref 0.3–1.2)
Total Protein: 7.7 g/dL (ref 6.0–8.3)

## 2014-08-23 LAB — RAPID URINE DRUG SCREEN, HOSP PERFORMED
Amphetamines: NOT DETECTED
Barbiturates: NOT DETECTED
Benzodiazepines: NOT DETECTED
Cocaine: NOT DETECTED
Opiates: NOT DETECTED
Tetrahydrocannabinol: NOT DETECTED

## 2014-08-23 LAB — ETHANOL: Alcohol, Ethyl (B): <5 mg/dL (ref 0–9)

## 2014-08-23 LAB — ACETAMINOPHEN LEVEL: Acetaminophen (Tylenol), Serum: <10 ug/mL — ABNORMAL LOW (ref 10–30)

## 2014-08-23 LAB — SALICYLATE LEVEL: Salicylate Lvl: <4 mg/dL (ref 2.8–20.0)

## 2014-08-23 MED ORDER — ZOLPIDEM TARTRATE 5 MG PO TABS: 5.0000 mg | ORAL_TABLET | Freq: Every evening | ORAL | Status: DC | PRN

## 2014-08-23 MED ORDER — ALUM & MAG HYDROXIDE-SIMETH 200-200-20 MG/5ML PO SUSP: 30.0000 mL | ORAL | Status: DC | PRN

## 2014-08-23 MED ORDER — LORAZEPAM 1 MG PO TABS: 1.0000 mg | ORAL_TABLET | Freq: Three times a day (TID) | ORAL | Status: DC | PRN

## 2014-08-23 MED ORDER — ARIPIPRAZOLE 10 MG PO TABS
10.0000 mg | ORAL_TABLET | Freq: Every day | ORAL | Status: DC
  Administered 2014-08-24 – 2014-08-25 (×2): 10 mg via ORAL
  Filled 2014-08-23 (×2): qty 1

## 2014-08-23 MED ORDER — IBUPROFEN 200 MG PO TABS: 600.0000 mg | ORAL_TABLET | Freq: Three times a day (TID) | ORAL | Status: DC | PRN

## 2014-08-23 MED ORDER — NICOTINE 21 MG/24HR TD PT24
21.0000 mg | MEDICATED_PATCH | Freq: Every day | TRANSDERMAL | Status: DC
  Administered 2014-08-24 – 2014-08-25 (×2): 21 mg via TRANSDERMAL
  Filled 2014-08-23 (×2): qty 1

## 2014-08-23 MED ORDER — ONDANSETRON HCL 4 MG PO TABS: 4.0000 mg | ORAL_TABLET | Freq: Three times a day (TID) | ORAL | Status: DC | PRN

## 2014-08-23 MED ORDER — TRAZODONE HCL 50 MG PO TABS: 50.0000 mg | ORAL_TABLET | Freq: Every day | ORAL | Status: DC

## 2014-08-23 MED ORDER — ACETAMINOPHEN 325 MG PO TABS: 650.0000 mg | ORAL_TABLET | ORAL | Status: DC | PRN
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
## 2014-08-23 NOTE — BH Assessment (Signed)
Writer informed TTS ([place]) of the consult.  

## 2014-08-23 NOTE — ED Notes (Signed)
Pt Aunt Drinda Buttsnnette Christian [phone]. Please call if need.
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
## 2014-08-23 NOTE — ED Notes (Signed)
Patient calm, cooperative. Lying in bed throughout the evening. Denies SI, HI, AVH. Denies feelings of anxiety and of depression. Denies any issue or change in sleep habits or appetite.   Encouragement offered. Given snack.  Q 15 safety checks continue.

## 2014-08-23 NOTE — ED Notes (Signed)
Patient's mother called and was very upset.  She states, "If you discharge him, I will have to sue the hospital! He is not ready to be discharged!  He has multiple personalities!"  She continued to be irate and demanding.  Provider also spoke with mother and agreed that patient will stay another night for further assessment.
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
## 2014-08-23 NOTE — ED Notes (Signed)
MD at bedside. 
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
## 2014-08-24 DIAGNOSIS — F319 Bipolar disorder, unspecified: Secondary | ICD-10-CM | POA: Insufficient documentation

## 2014-08-24 DIAGNOSIS — F919 Conduct disorder, unspecified: Secondary | ICD-10-CM | POA: Diagnosis not present

## 2014-08-24 DIAGNOSIS — F3110 Bipolar disorder, current episode manic without psychotic features, unspecified: Secondary | ICD-10-CM | POA: Diagnosis not present

## 2014-08-24 MED ORDER — LAMOTRIGINE 25 MG PO TABS
25.0000 mg | ORAL_TABLET | Freq: Every day | ORAL | Status: DC
  Administered 2014-08-25: 25 mg via ORAL
  Filled 2014-08-24 (×2): qty 1

## 2014-08-24 NOTE — ED Notes (Signed)
Patient pleasant, cooperative. Denies SI, HI, AVH. Denies feelings of anxiety and depression. Patient more engaged that previous evening. Patient made phone calls, more interaction with staff.  Encouragement offered. Patient provided beverage. Patient to take a shower.  Q 15 safety checks in place.
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
## 2014-08-24 NOTE — ED Notes (Signed)
Patient has been up in his room and out to the phone a few times.  He has been pleasant and cooperative.  States he feels ready to go home, but states he also understands that he could likely benefit from some help.  Has asked many questions and verbalizes understanding of all explanations.  He is currently awaiting a mental health bed.

## 2014-08-25 ENCOUNTER — Inpatient Hospital Stay (HOSPITAL_COMMUNITY): Admission: AD | Admit: 2014-08-25 | Payer: Medicare Other | Source: Intra-hospital | Admitting: Psychiatry

## 2014-08-25 DIAGNOSIS — F319 Bipolar disorder, unspecified: Secondary | ICD-10-CM | POA: Diagnosis not present

## 2014-08-25 DIAGNOSIS — R4689 Other symptoms and signs involving appearance and behavior: Secondary | ICD-10-CM | POA: Diagnosis present

## 2014-08-25 DIAGNOSIS — F919 Conduct disorder, unspecified: Secondary | ICD-10-CM | POA: Diagnosis not present

## 2014-08-25 MED ORDER — LAMOTRIGINE 25 MG PO TABS: 25.0000 mg | ORAL_TABLET | Freq: Every day | ORAL | Status: DC

## 2014-08-25 MED ORDER — ARIPIPRAZOLE 10 MG PO TABS: 10.0000 mg | ORAL_TABLET | Freq: Every day | ORAL | Status: AC

## 2014-08-25 MED ORDER — TRAZODONE HCL 50 MG PO TABS: 50.0000 mg | ORAL_TABLET | Freq: Every day | ORAL | Status: DC

## 2014-08-25 NOTE — [facility] Counselor (Signed)
TTS Counselor faxed pt referral to the following facilities in effort to obtain inpt placement:  [facility]

## 2014-08-25 NOTE — [facility] Assessment (Signed)
[facility]  08/24/2014 Pt accepted to [facility] per Tanna SavoyEric, AC. Will be transported by police.

## 2014-08-25 NOTE — Discharge Instructions (Signed)
For your ongoing mental health needs, you are advised to follow up with [facility].  You have an appointment scheduled with Bary CastillaShelly Everts on Thursday, 10/05/2014 at 1:40 pm:       [facility]      [address]      [place], [place] [address]      [phone]

## 2014-08-25 NOTE — BH Assessment (Signed)
[facility] Assessment Progress Note  Per Thedore MinsMojeed Akintayo, MD this pt does not require psychiatric hospitalization at this time.  He is to be discharged with [facility].  Pt has signed Consent to Release Information to them.  [facility] it was found that pt has an appointment there with Bary CastillaShelly Everts on 10/05/2014 at 13:40.  This information has been included in pt's discharge instructions.  Pt's nurse, Carlisle BeersLuann, has been notified.  Doylene Canninghomas Shahidah Nesbitt, MA Triage Specialist 08/25/2014 @ 10:39

## 2014-08-25 NOTE — Consult Note (Signed)
[facility]   Reason for Consult:  Aggressive behaviors Referring Physician:  EDP Patient Identification: DASANI CREAR MRN:  [id number] Principal Diagnosis: Bipolar affective disorder, current episode moderate Diagnosis:   Patient Active Problem List   Diagnosis Date Noted  . Bipolar affective disorder, current episode moderate [F31.9] 08/25/2014    Priority: High  . Aggressive behavior [F60.89] 08/25/2014    Priority: High  . Tobacco abuse [Z72.0] 09/19/2008  . OTHER SPECIFIED VISUAL DISTURBANCES [H53.8] 09/19/2008  Review of Systems  Constitutional: Negative.   HENT: Negative.   Eyes: Negative.   Respiratory: Negative.   Cardiovascular: Negative.   Gastrointestinal: Negative.   Genitourinary: Negative.   Musculoskeletal: Negative.   Skin: Negative.   Neurological: Negative.   Endo/Heme/Allergies: Negative.   Psychiatric/Behavioral: Negative.      Total Time spent with patient: 30 minutes  Subjective:   ANDRON MARRAZZO is a 24 y.o. male patient has stabilized.  HPI:  The patient has restarted his medications and has stabilized.  He denies suicidal/homicidal ideations, hallucinations, and alcohol/drug abuse.  He states he feels "very good today",  Specifically his mood and attitude.  Thayer Ohm apologized to his family yesterday and plans on staying with his mother or boss over the weekend to let things rest between his aunt and her boyfriend.  He works at an [employer] ([employer] place and his boss is very supportive.  Thayer Ohm will follow-up with [facility] for his care.   Past Medical History:  Past Medical History  Diagnosis Date  . Bipolar 1 disorder    History reviewed. No pertinent past surgical history. Family History:  Family History  Problem Relation Age of Onset  . Hyperlipidemia Mother   . Hypertension Mother   . Depression Mother   . Osteoporosis Mother   . Diabetes Maternal Grandfather   . Heart disease Maternal Grandfather    Social History:   History  Alcohol Use No     History  Drug Use No    History   Social History  . Marital Status: Single    Spouse Name: N/A  . Number of Children: N/A  . Years of Education: N/A   Social History Main Topics  . Smoking status: Never Smoker   . Smokeless tobacco: Not on file  . Alcohol Use: No  . Drug Use: No  . Sexual Activity: Not on file   Other Topics Concern  . None   Social History Narrative   Additional Social History:    Pain Medications: SEE MAR Prescriptions: SEE MAR Over the Counter: SEE MAR History of alcohol / drug use?: Yes Name of Substance 1: THC 1 - Age of First Use: n/a 1 - Amount (size/oz): n/a 1 - Frequency: n/a 1 - Duration: n/a 1 - Last Use / Amount: teens                   Allergies:  No Known Allergies  Vitals: Blood pressure 127/73, pulse 68, temperature 97.9 F (36.6 C), temperature source Oral, resp. rate 18, SpO2 99 %.  Risk to Self: Suicidal Ideation: No Suicidal Intent: No Is patient at risk for suicide?: No Suicidal Plan?: No Access to Means: No What has been your use of drugs/alcohol within the last 12 months?:  (patient denies current alcohol/drug use) How many times?:  (n/a) Other Self Harm Risks:  (n/a) Triggers for Past Attempts: Other (Comment) Intentional Self Injurious Behavior: None Risk to Others: Homicidal Ideation: No Thoughts of Harm to Others: No Current  Homicidal Intent: No Current Homicidal Plan: No Access to Homicidal Means: No Identified Victim:  (n/a) History of harm to others?: No Assessment of Violence: None Noted Violent Behavior Description:  (Patient is calm and cooperative ) Does patient have access to weapons?: No Criminal Charges Pending?: No Does patient have a court date: No Prior Inpatient Therapy: Prior Inpatient Therapy: Yes Prior Therapy Dates:  (2009) Prior Therapy Facilty/Provider(s):  [facility] Reason for Treatment:  (" I think It was b/c I was smoking THC") Prior Outpatient  Therapy: Prior Outpatient Therapy: Yes Prior Therapy Dates:  (current) Prior Therapy Facilty/Provider(s):  [facility] ) Reason for Treatment:  (medication managment)  Current Facility-Administered Medications  Medication Dose Route Frequency Provider Last Rate Last Dose  . acetaminophen (TYLENOL) tablet 650 mg  650 mg Oral Q4H PRN Elwin MochaBlair Walden, MD      . alum & mag hydroxide-simeth (MAALOX/MYLANTA) 200-200-20 MG/5ML suspension 30 mL  30 mL Oral PRN Elwin MochaBlair Walden, MD      . ARIPiprazole (ABILIFY) tablet 10 mg  10 mg Oral Daily Elwin MochaBlair Walden, MD   10 mg at 08/25/14 0911  . ibuprofen (ADVIL,MOTRIN) tablet 600 mg  600 mg Oral Q8H PRN Elwin MochaBlair Walden, MD      . lamoTRIgine (LAMICTAL) tablet 25 mg  25 mg Oral Daily Earney NavyJosephine C Onuoha, NP   25 mg at 08/25/14 0911  . LORazepam (ATIVAN) tablet 1 mg  1 mg Oral Q8H PRN Elwin MochaBlair Walden, MD      . nicotine (NICODERM CQ - dosed in mg/24 hours) patch 21 mg  21 mg Transdermal Daily Elwin MochaBlair Walden, MD   21 mg at 08/25/14 0911  . ondansetron (ZOFRAN) tablet 4 mg  4 mg Oral Q8H PRN Elwin MochaBlair Walden, MD      . traZODone (DESYREL) tablet 50 mg  50 mg Oral QHS Elwin MochaBlair Walden, MD   Stopped at 08/24/14 0130  . zolpidem (AMBIEN) tablet 5 mg  5 mg Oral QHS PRN Elwin MochaBlair Walden, MD       Current Outpatient Prescriptions  Medication Sig Dispense Refill  . ARIPiprazole (ABILIFY) 10 MG tablet Take 10 mg by mouth daily.    Marland Kitchen. PRESCRIPTION MEDICATION Apply 1 application topically daily as needed (blemishes). Pt applies to face    . traZODone (DESYREL) 50 MG tablet Take 50 mg by mouth at bedtime.    . meloxicam (MOBIC) 15 MG tablet Take 1 tablet (15 mg total) by mouth daily. (Patient not taking: Reported on 08/23/2014) 15 tablet 0  . methocarbamol (ROBAXIN) 500 MG tablet Take 1 tablet (500 mg total) by mouth 3 (three) times daily. (Patient not taking: Reported on 08/23/2014) 30 tablet 0  . nicotine (NICOTROL) 10 MG inhaler Use 1 cartridge 5-6 times daily, change cartridges at noon (Patient not  taking: Reported on 08/23/2014) 168 each 1  . traMADol (ULTRAM) 50 MG tablet Take 2 tablets (100 mg total) by mouth every 8 (eight) hours as needed for pain. (Patient not taking: Reported on 08/23/2014) 30 tablet 0    Musculoskeletal: Strength & Muscle Tone: within normal limits Gait & Station: normal Patient leans: N/A  Psychiatric Specialty Exam:     Blood pressure 127/73, pulse 68, temperature 97.9 F (36.6 C), temperature source Oral, resp. rate 18, SpO2 99 %.There is no weight on file to calculate BMI.  General Appearance: Casual  Eye Contact::  Good  Speech:  Normal Rate  Volume:  Normal  Mood:  Euthymic  Affect:  Congruent  Thought Process:  Coherent  Orientation:  Full (Time, Place, and Person)  Thought Content:  WDL  Suicidal Thoughts:  No  Homicidal Thoughts:  No  Memory:  Immediate;   Good Recent;   Good Remote;   Good  Judgement:  Fair  Insight:  Good  Psychomotor Activity:  Normal  Concentration:  Good  Recall:  Good  Fund of Knowledge:Good  Language: Good  Akathisia:  No  Handed:  Right  AIMS (if indicated):     Assets:  Housing Leisure Time Physical Health Resilience Social Support Vocational/Educational  ADL's:  Intact  Cognition: WNL  Sleep:      Medical Decision Making: Review of Psycho-Social Stressors (1), Review or order clinical lab tests (1) and Review of Medication Regimen & Side Effects (2)  Treatment Plan Summary: Daily contact with patient to assess and evaluate symptoms and progress in treatment, Medication management and Plan Discharge home with follow[facility]up at [facility]  Plan:  No evidence of imminent risk to self or others at present.   Disposition: Discharge home with follow-up at [facility], PMH-NP 08/25/2014 10:45 AM I have personally seen the patient and agreed with the findings and involved in the treatment plan. Thedore Mins, MD

## 2014-08-25 NOTE — [facility] Suicide Risk Assessment (Signed)
Suicide Risk Assessment  Discharge Assessment   [facility]   Demographic Factors:  Male  Total Time spent with patient: 30 minutes  Musculoskeletal: Strength & Muscle Tone: within normal limits Gait & Station: normal Patient leans: N/A  Psychiatric Specialty Exam:     Blood pressure 127/73, pulse 68, temperature 97.9 F (36.6 C), temperature source Oral, resp. rate 18, SpO2 99 %.There is no weight on file to calculate BMI.  General Appearance: Casual  Eye Contact::  Good  Speech:  Normal Rate  Volume:  Normal  Mood:  Euthymic  Affect:  Congruent  Thought Process:  Coherent  Orientation:  Full (Time, Place, and Person)  Thought Content:  WDL  Suicidal Thoughts:  No  Homicidal Thoughts:  No  Memory:  Immediate;   Good Recent;   Good Remote;   Good  Judgement:  Fair  Insight:  Good  Psychomotor Activity:  Normal  Concentration:  Good  Recall:  Good  Fund of Knowledge:Good  Language: Good  Akathisia:  No  Handed:  Right  AIMS (if indicated):     Assets:  Housing Leisure Time Physical Health Resilience Social Support Vocational/Educational  ADL's:  Intact  Cognition: WNL  Sleep:      Has this patient used any form of tobacco in the last 30 days? (Cigarettes, Smokeless Tobacco, Cigars, and/or Pipes) Yes, A prescription for an FDA-approved tobacco cessation medication was offered at discharge and the patient refused  Mental Status Per Nursing Assessment::   On Admission:   Aggressive behaviors  Current Mental Status by Physician: NA  Loss Factors: NA  Historical Factors: NA  Risk Reduction Factors:   Sense of responsibility to family, Employed, Living with another person, especially a relative, Positive social support, Positive therapeutic relationship and Positive coping skills or problem solving skills  Continued Clinical Symptoms:  None  Cognitive Features That Contribute To Risk:  None    Suicide Risk:  Minimal: No  identifiable suicidal ideation.  Patients presenting with no risk factors but with morbid ruminations; may be classified as minimal risk based on the severity of the depressive symptoms  Principal Problem: Bipolar affective disorder, current episode moderate Discharge Diagnoses:  Patient Active Problem List   Diagnosis Date Noted  . Bipolar affective disorder, current episode moderate [F31.9] 08/25/2014    Priority: High  . Aggressive behavior [F60.89] 08/25/2014    Priority: High  . Tobacco abuse [Z72.0] 09/19/2008  . OTHER SPECIFIED VISUAL DISTURBANCES [H53.8] 09/19/2008      Plan Of Care/Follow-up recommendations:  Activity:  as tolerated Diet:  heart healthy diet  Is patient on multiple antipsychotic therapies at discharge:  No   Has Patient had three or more failed trials of antipsychotic monotherapy by history:  No  Recommended Plan for Multiple Antipsychotic Therapies: NA    Jasir Rother, PMH-NP 08/25/2014, 10:52 AM

## 2014-10-05 DIAGNOSIS — F250 Schizoaffective disorder, bipolar type: Secondary | ICD-10-CM | POA: Diagnosis not present

## 2014-12-04 ENCOUNTER — Emergency Department (INDEPENDENT_AMBULATORY_CARE_PROVIDER_SITE_OTHER)
Admission: EM | Admit: 2014-12-04 | Discharge: 2014-12-04 | Disposition: A | Discharge status: Home / Self Care | Payer: Medicare Other | Source: Home / Self Care | Attending: Family Medicine | Admitting: Family Medicine

## 2014-12-04 ENCOUNTER — Encounter (HOSPITAL_COMMUNITY): Payer: Self-pay | Admitting: Emergency Medicine

## 2014-12-04 DIAGNOSIS — H6123 Impacted cerumen, bilateral: Secondary | ICD-10-CM

## 2014-12-04 NOTE — ED Notes (Signed)
Left ear pain, muffled, onset last night.

## 2014-12-04 NOTE — ED Notes (Signed)
Bilateral ears irrigated, dr Artis Flockkindl evaluated ears post irrigation and performed irrigation as well

## 2014-12-04 NOTE — ED Provider Notes (Signed)
CSN: [id number]     Arrival date & time 12/04/14  1759 History   First MD Initiated Contact with Patient 12/04/14 1905     Chief Complaint  Patient presents with  . Otalgia   (Consider location/radiation/quality/duration/timing/severity/associated sxs/prior Treatment) Patient is a 24 y.o. male presenting with ear pain. The history is provided by the patient.  Otalgia Location:  Left Behind ear:  No abnormality Quality:  Dull Severity:  Mild Onset quality:  Gradual Duration:  2 days Progression:  Unchanged Chronicity:  New Context comment:  Concern about cerumen impaction Relieved by:  None tried Worsened by:  Nothing tried Associated symptoms: no ear discharge     Past Medical History  Diagnosis Date  . Bipolar 1 disorder    History reviewed. No pertinent past surgical history. Family History  Problem Relation Age of Onset  . Hyperlipidemia Mother   . Hypertension Mother   . Depression Mother   . Osteoporosis Mother   . Diabetes Maternal Grandfather   . Heart disease Maternal Grandfather    History  Substance Use Topics  . Smoking status: Current Every Day Smoker  . Smokeless tobacco: Not on file  . Alcohol Use: No    Review of Systems  Constitutional: Negative.   HENT: Positive for ear pain. Negative for ear discharge.     Allergies  Review of patient's allergies indicates no known allergies.  Home Medications   Prior to Admission medications   Medication Sig Start Date End Date Taking? Authorizing Provider  ARIPiprazole (ABILIFY) 10 MG tablet Take 1 tablet (10 mg total) by mouth daily. 08/25/14   Charm RingsJamison Y Lord, NP  lamoTRIgine (LAMICTAL) 25 MG tablet Take 1 tablet (25 mg total) by mouth daily. 08/25/14   Charm RingsJamison Y Lord, NP  meloxicam (MOBIC) 15 MG tablet Take 1 tablet (15 mg total) by mouth daily. Patient not taking: Reported on 08/23/2014 06/28/12   Reuben Likesavid C Keller, MD  methocarbamol (ROBAXIN) 500 MG tablet Take 1 tablet (500 mg total) by mouth 3 (three)  times daily. Patient not taking: Reported on 08/23/2014 06/28/12   Reuben Likesavid C Keller, MD  nicotine (NICOTROL) 10 MG inhaler Use 1 cartridge 5-6 times daily, change cartridges at noon Patient not taking: Reported on 08/23/2014 08/01/13   Elenora GammaSamuel L Bradshaw, MD  PRESCRIPTION MEDICATION Apply 1 application topically daily as needed (blemishes). Pt applies to face    Historical Provider, MD  traMADol (ULTRAM) 50 MG tablet Take 2 tablets (100 mg total) by mouth every 8 (eight) hours as needed for pain. Patient not taking: Reported on 08/23/2014 06/28/12   Reuben Likesavid C Keller, MD  traZODone (DESYREL) 50 MG tablet Take 1 tablet (50 mg total) by mouth at bedtime. 08/25/14   Charm RingsJamison Y Lord, NP   BP 105/65 mmHg  Pulse 54  Temp(Src) 97.2 F (36.2 C) (Oral)  Resp 16  SpO2 99% Physical Exam  Constitutional: He is oriented to person, place, and time. He appears well-developed and well-nourished. No distress.  HENT:  Head: Normocephalic.  Ears:  Mouth/Throat: Oropharynx is clear and moist.  Eyes: Conjunctivae are normal. Pupils are equal, round, and reactive to light.  Neck: Normal range of motion. Neck supple.  Lymphadenopathy:    He has no cervical adenopathy.  Neurological: He is alert and oriented to person, place, and time.  Skin: Skin is warm and dry.  Nursing note and vitals reviewed.   ED Course  Procedures (including critical care time) Labs Review Labs Reviewed - No data to display  Imaging Review No results found.   MDM   1. Cerumen impaction, bilateral    Sx relieved with ear irrigation    Linna Hoff, MD 12/04/14 [phone]

## 2014-12-21 DIAGNOSIS — F250 Schizoaffective disorder, bipolar type: Secondary | ICD-10-CM | POA: Diagnosis not present

## 2015-03-23 DIAGNOSIS — F250 Schizoaffective disorder, bipolar type: Secondary | ICD-10-CM | POA: Diagnosis not present

## 2015-09-25 ENCOUNTER — Emergency Department (HOSPITAL_COMMUNITY)
Admission: EM | Admit: 2015-09-25 | Discharge: 2015-09-25 | Disposition: A | Discharge status: Home / Self Care | Payer: Medicare Other | Attending: Emergency Medicine | Admitting: Emergency Medicine

## 2015-09-25 DIAGNOSIS — Z79899 Other long term (current) drug therapy: Secondary | ICD-10-CM | POA: Diagnosis not present

## 2015-09-25 DIAGNOSIS — L723 Sebaceous cyst: Secondary | ICD-10-CM | POA: Insufficient documentation

## 2015-09-25 DIAGNOSIS — F319 Bipolar disorder, unspecified: Secondary | ICD-10-CM | POA: Insufficient documentation

## 2015-09-25 DIAGNOSIS — F17200 Nicotine dependence, unspecified, uncomplicated: Secondary | ICD-10-CM | POA: Diagnosis not present

## 2015-09-25 DIAGNOSIS — L089 Local infection of the skin and subcutaneous tissue, unspecified: Secondary | ICD-10-CM

## 2015-09-25 DIAGNOSIS — L0201 Cutaneous abscess of face: Secondary | ICD-10-CM | POA: Diagnosis not present

## 2015-09-25 DIAGNOSIS — R220 Localized swelling, mass and lump, head: Secondary | ICD-10-CM | POA: Diagnosis present

## 2015-09-25 MED ORDER — TRAMADOL HCL 50 MG PO TABS
50.0000 mg | ORAL_TABLET | Freq: Once | ORAL | Status: AC
  Administered 2015-09-25: 50 mg via ORAL
  Filled 2015-09-25: qty 1

## 2015-09-25 MED ORDER — TRAMADOL HCL 50 MG PO TABS: 50.0000 mg | ORAL_TABLET | Freq: Four times a day (QID) | ORAL | Status: DC | PRN

## 2015-09-25 MED ORDER — CEPHALEXIN 500 MG PO CAPS: 500.0000 mg | ORAL_CAPSULE | Freq: Four times a day (QID) | ORAL | Status: DC

## 2015-09-25 MED ORDER — CEPHALEXIN 500 MG PO CAPS
500.0000 mg | ORAL_CAPSULE | Freq: Once | ORAL | Status: AC
  Administered 2015-09-25: 500 mg via ORAL
  Filled 2015-09-25: qty 1

## 2015-09-25 NOTE — Discharge Instructions (Signed)
Sebaceous Cyst Removal Sebaceous cyst removal is a procedure to remove a sac of oily material that forms under your skin (sebaceous cyst). Sebaceous cysts may also be called epidermoid cysts or keratin cysts. Normally, the skin secretes this oily material through a gland or a hair follicle. This type of cyst usually results when a skin gland or hair follicle becomes blocked. You may need this procedure if you have a sebaceous cyst that becomes large, uncomfortable, or infected. LET [facility] CARE PROVIDER KNOW ABOUT:  Any allergies you have.  All medicines you are taking, including vitamins, herbs, eye drops, creams, and over-the-counter medicines.  Previous problems you or members of your family have had with the use of anesthetics.  Any blood disorders you have.  Previous surgeries you have had.  Medical conditions you have. RISKS AND COMPLICATIONS Generally, this is a safe procedure. However, problems may occur, including:  Developing another cyst.  Bleeding.  Infection.  Scarring. BEFORE THE PROCEDURE  Ask your health care provider about:  Changing or stopping your regular medicines. This is especially important if you are taking diabetes medicines or blood thinners.  Taking medicines such as aspirin and ibuprofen. These medicines can thin your blood. Do not take these medicines before your procedure if your health care provider instructs you not to.  If you have an infected cyst, you may have to take antibiotic medicines before or after the cyst removal. Take your antibiotics as directed by your health care provider. Finish all of the medicine even if you start to feel better.  Take a shower on the morning of your procedure. Your health care provider may ask you to use a germ-killing (antiseptic) soap. PROCEDURE  You will be given a medicine that numbs the area (local anesthetic).  The skin around the cyst will be cleaned with a germ-killing solution  (antiseptic).  Your health care provider will make a small surgical incision over the cyst.  The cyst will be separated from the surrounding tissues that are under your skin.  If possible, the cyst will be removed undamaged (intact).  If the cyst bursts (ruptures), it will need to be removed in pieces.  After the cyst is removed, your health care provider will control any bleeding and close the incision with small stitches (sutures). Small incisions may not need sutures, and the bleeding will be controlled by applying direct pressure with gauze.  Your health care provider may apply antibiotic ointment and a light bandage (dressing) over the incision. This procedure may vary among health care providers and hospitals. AFTER THE PROCEDURE  If your cyst ruptured during surgery, you may need to take antibiotic medicine. If you were prescribed an antibiotic medicine, finish all of it even if you start to feel better.   This information is not intended to replace advice given to you by your health care provider. Make sure you discuss any questions you have with your health care provider.   Document Released: 06/13/2000 Document Revised: 07/07/2014 Document Reviewed: 03/01/2014 Elsevier Interactive Patient Education 2016 Elsevier Inc[employer] Abscess An abscess is an infected area that contains a collection of pus and debris.It can occur in almost any part of the body. An abscess is also known as a furuncle or boil. CAUSES  An abscess occurs when tissue gets infected. This can occur from blockage of oil or sweat glands, infection of hair follicles, or a minor injury to the skin. As the body tries to fight the infection, pus collects in the area  and creates pressure under the skin. This pressure causes pain. People with weakened immune systems have difficulty fighting infections and get certain abscesses more often.  SYMPTOMS Usually an abscess develops on the skin and becomes a painful mass that is red,  warm, and tender. If the abscess forms under the skin, you may feel a moveable soft area under the skin. Some abscesses break open (rupture) on their own, but most will continue to get worse without care. The infection can spread deeper into the body and eventually into the bloodstream, causing you to feel ill.  DIAGNOSIS  Your caregiver will take your medical history and perform a physical exam. A sample of fluid may also be taken from the abscess to determine what is causing your infection. TREATMENT  Your caregiver may prescribe antibiotic medicines to fight the infection. However, taking antibiotics alone usually does not cure an abscess. Your caregiver may need to make a small cut (incision) in the abscess to drain the pus. In some cases, gauze is packed into the abscess to reduce pain and to continue draining the area. HOME CARE INSTRUCTIONS   Only take over-the-counter or prescription medicines for pain, discomfort, or fever as directed by your caregiver.  If you were prescribed antibiotics, take them as directed. Finish them even if you start to feel better.  If gauze is used, follow your caregiver's directions for changing the gauze.  To avoid spreading the infection:  Keep your draining abscess covered with a bandage.  Wash your hands well.  Do not share personal care items, towels, or whirlpools with others.  Avoid skin contact with others.  Keep your skin and clothes clean around the abscess.  Keep all follow-up appointments as directed by your caregiver. SEEK MEDICAL CARE IF:   You have increased pain, swelling, redness, fluid drainage, or bleeding.  You have muscle aches, chills, or a general ill feeling.  You have a fever. MAKE SURE YOU:   Understand these instructions.  Will watch your condition.  Will get help right away if you are not doing well or get worse.   This information is not intended to replace advice given to you by your health care provider. Make  sure you discuss any questions you have with your health care provider.   Document Released: 03/26/2005 Document Revised: 12/16/2011 Document Reviewed: 08/29/2011 Elsevier Interactive Patient Education 2016 Elsevier Inc. [employer] Therapy Heat therapy can help ease sore, stiff, injured, and tight muscles and joints. Heat relaxes your muscles, which may help ease your pain.  RISKS AND COMPLICATIONS If you have any of the following conditions, do not use heat therapy unless your health care provider has approved:  Poor circulation.  Healing wounds or scarred skin in the area being treated.  Diabetes, heart disease, or high blood pressure.  Not being able to feel (numbness) the area being treated.  Unusual swelling of the area being treated.  Active infections.  Blood clots.  Cancer.  Inability to communicate pain. This may include young children and people who have problems with their brain function (dementia).  Pregnancy. Heat therapy should only be used on old, pre-existing, or long-lasting (chronic) injuries. Do not use heat therapy on new injuries unless directed by your health care provider. HOW TO USE HEAT THERAPY There are several different kinds of heat therapy, including:  Moist heat pack.  Warm water bath.  Hot water bottle.  Electric heating pad.  Heated gel pack.  Heated wrap.  Electric heating pad. Use the  heat therapy method suggested by your health care provider. Follow your health care provider's instructions on when and how to use heat therapy. GENERAL HEAT THERAPY RECOMMENDATIONS  Do not sleep while using heat therapy. Only use heat therapy while you are awake.  Your skin may turn pink while using heat therapy. Do not use heat therapy if your skin turns red.  Do not use heat therapy if you have new pain.  High heat or long exposure to heat can cause burns. Be careful when using heat therapy to avoid burning your skin.  Do not use heat therapy on  areas of your skin that are already irritated, such as with a rash or sunburn. SEEK MEDICAL CARE IF:  You have blisters, redness, swelling, or numbness.  You have new pain.  Your pain is worse. MAKE SURE YOU:  Understand these instructions.  Will watch your condition.  Will get help right away if you are not doing well or get worse.   This information is not intended to replace advice given to you by your health care provider. Make sure you discuss any questions you have with your health care provider.   Document Released: 09/08/2011 Document Revised: 07/07/2014 Document Reviewed: 08/09/2013 Elsevier Interactive Patient Education [employer] Elsevier Inc[employer]

## 2015-09-25 NOTE — ED Provider Notes (Signed)
CSN: [id number]     Arrival date & time 09/25/15  1921 History  By signing my name below, I, [name] [name], attest that this documentation has been prepared under the direction and in the presence of Elpidio Anis, PA-C. Electronically Signed: Tanda [name], ED Scribe. 09/25/2015. 10:08 PM.   Chief Complaint  Patient presents with  . Abscess   The history is provided by the patient. No language interpreter was used.     HPI Comments: DEL OVERFELT is a 25 y.o. male who presents to the Emergency Department complaining of gradual onset, constant, area of redness, swelling, and pain to left cheek x 2 days. Pt reports intermittent "knot" to area x 1.5 years. Pt attempted to pop the knot the other day prior to it increasing in size. He notes green purulent drainage to the area 2 nights ago when popping. Denies fever, chills, or any other associated symptoms. No hx DM.    Past Medical History  Diagnosis Date  . Bipolar 1 disorder    No past surgical history on file. Family History  Problem Relation Age of Onset  . Hyperlipidemia Mother   . Hypertension Mother   . Depression Mother   . Osteoporosis Mother   . Diabetes Maternal Grandfather   . Heart disease Maternal Grandfather    Social History  Substance Use Topics  . Smoking status: Current Every Day Smoker  . Smokeless tobacco: Not on file  . Alcohol Use: No    Review of Systems  Constitutional: Negative for fever and chills.  Skin:       + Abscess to left cheek.  + Drainage    Allergies  Review of patient's allergies indicates no known allergies.  Home Medications   Prior to Admission medications   Medication Sig Start Date End Date Taking? Authorizing Provider  ARIPiprazole (ABILIFY) 10 MG tablet Take 1 tablet (10 mg total) by mouth daily. 08/25/14   Charm Rings, NP  lamoTRIgine (LAMICTAL) 25 MG tablet Take 1 tablet (25 mg total) by mouth daily. 08/25/14   Charm Rings, NP  meloxicam (MOBIC) 15 MG tablet  Take 1 tablet (15 mg total) by mouth daily. Patient not taking: Reported on 08/23/2014 06/28/12   Reuben Likes, MD  methocarbamol (ROBAXIN) 500 MG tablet Take 1 tablet (500 mg total) by mouth 3 (three) times daily. Patient not taking: Reported on 08/23/2014 06/28/12   Reuben Likes, MD  nicotine (NICOTROL) 10 MG inhaler Use 1 cartridge 5-6 times daily, change cartridges at noon Patient not taking: Reported on 08/23/2014 08/01/13   Elenora Gamma, MD  PRESCRIPTION MEDICATION Apply 1 application topically daily as needed (blemishes). Pt applies to face    Historical Provider, MD  traMADol (ULTRAM) 50 MG tablet Take 2 tablets (100 mg total) by mouth every 8 (eight) hours as needed for pain. Patient not taking: Reported on 08/23/2014 06/28/12   Reuben Likes, MD  traZODone (DESYREL) 50 MG tablet Take 1 tablet (50 mg total) by mouth at bedtime. 08/25/14   Charm Rings, NP   BP 141/80 mmHg  Pulse 78  Temp(Src) 98.3 F (36.8 C) (Oral)  Resp 16  SpO2 97%   Physical Exam  Constitutional: He is oriented to person, place, and time. He appears well-developed and well-nourished. No distress.  HENT:  Head: Atraumatic.  1 cm raised tender nodule to left cheek. No surrounding erythema. No active drainage. No buccal involvement.   Eyes: Conjunctivae and EOM are normal.  Neck: Neck supple. No tracheal deviation present.  Cardiovascular: Normal rate.   Pulmonary/Chest: Effort normal. No respiratory distress.  Musculoskeletal: Normal range of motion.  Neurological: He is alert and oriented to person, place, and time.  Skin: Skin is warm and dry.  Psychiatric: He has a normal mood and affect. His behavior is normal.  Nursing note and vitals reviewed.   ED Course  Procedures (including critical care time)  INCISION AND DRAINAGE PROCEDURE NOTE: Patient identification was confirmed and verbal consent was obtained. This procedure was performed by Elpidio AnisShari Jacie Tristan, PA - C at 10:40 PM. Site: Left  cheek Sterile procedures observed Needle size: 25 Anesthetic used (type and amt): < 1 CCs 1% Lidocaine Blade size: none - 18G needle for aspiration Drainage: Mixed purulent drainage with sebaceous material Complexity: simple Site anesthetized, no incision made over site as it was facial abscess; wound drained by pus being expressed through opening already present; covered with dry, sterile dressing.  Pt tolerated procedure well without complications.  Instructions for care discussed verbally and pt provided with additional written instructions for homecare and f/u.   DIAGNOSTIC STUDIES: Oxygen Saturation is 97% on RA, normal by my interpretation.    COORDINATION OF CARE: 10:03 PM-Discussed treatment plan with pt at bedside and pt agreed to plan.   Labs Review Labs Reviewed - No data to display  Imaging Review No results found.    EKG Interpretation None      MDM   Final diagnoses:  None   1. Facial infected sebaceous cyst  Area drained. Patient placed on abx. Recommended PCP follow up for recheck to insure complete, uncomplicated healing.   I personally performed the services described in this documentation, which was scribed in my presence. The recorded information has been reviewed and is accurate.       Elpidio AnisShari Hazelgrace Bonham, PA-C 09/26/15 2207  Samuel JesterKathleen McManus, DO 09/28/15 [id number]

## 2015-09-25 NOTE — ED Notes (Signed)
Pt states that he has had a an abscessed area on his L cheek x 1.5 years but it grew bigger and popped 2 days ago. Alert and oriented.

## 2015-09-27 ENCOUNTER — Ambulatory Visit: Payer: Self-pay | Admitting: Family Medicine

## 2015-10-02 DIAGNOSIS — L723 Sebaceous cyst: Secondary | ICD-10-CM | POA: Diagnosis not present

## 2015-10-29 DIAGNOSIS — L723 Sebaceous cyst: Secondary | ICD-10-CM

## 2015-10-29 HISTORY — DX: Sebaceous cyst: L72.3

## 2015-10-30 ENCOUNTER — Encounter (HOSPITAL_BASED_OUTPATIENT_CLINIC_OR_DEPARTMENT_OTHER): Payer: Self-pay | Admitting: *Deleted

## 2015-11-02 ENCOUNTER — Other Ambulatory Visit: Payer: Self-pay | Admitting: Plastic Surgery

## 2015-11-02 DIAGNOSIS — L723 Sebaceous cyst: Secondary | ICD-10-CM

## 2015-11-02 NOTE — H&P (Signed)
[name] ArgyleChristopher L Cumba is an 25 y.o. male.   Chief Complaint: cheek cyst HPI: The patient is a 25 yrs old bm here for a pre operative history and physical prior to excision of a cyst on his left cheek area. He states that it has been present for years. It gets big and then pops. Recently he went to the ED and had it drained. The area gets red and very tender. It was ~ 4 cm in size. It is now no red but slightly tender and ~ 1 cm with a hard core. He does not have any sign of infection or enlarge lymph nodes. He would like to have it remove so it does not continue to return. He is otherwise healthy and does not have any medical conditions. He is on medication for Bipolar I disorder.  Past Medical History  Diagnosis Date  . Bipolar 1 disorder (HCC)   . Impaired memory   . Sebaceous cyst 10/2015    left cheek (face)  . History of gastric ulcer     Past Surgical History  Procedure Laterality Date  . No past surgeries      Family History  Problem Relation Age of Onset  . Hyperlipidemia Mother   . Hypertension Mother   . Depression Mother   . Osteoporosis Mother   . Diabetes Maternal Grandfather   . Heart disease Maternal Grandfather    Social History:  reports that he has been smoking Cigarettes.  He has smoked for the past 5 years. He has never used smokeless tobacco. He reports that he does not drink alcohol or use illicit drugs.  Allergies: No Known Allergies   (Not in a hospital admission)  No results found for this or any previous visit (from the past 48 hour(s)). No results found.  Review of Systems  Constitutional: Negative.   HENT: Negative.   Eyes: Negative.   Respiratory: Negative.   Cardiovascular: Negative.   Gastrointestinal: Negative.   Genitourinary: Negative.   Musculoskeletal: Negative.  Negative for neck pain.  Skin: Negative.   Neurological: Negative.   Psychiatric/Behavioral: Negative.     There were no vitals taken for this visit. Physical Exam    Constitutional: He is oriented to person, place, and time. He appears well-developed and well-nourished.  HENT:  Head: Normocephalic and atraumatic.    Eyes: Conjunctivae and EOM are normal. Pupils are equal, round, and reactive to light.  Cardiovascular: Normal rate.   Respiratory: Effort normal.  GI: Soft.  Neurological: He is alert and oriented to person, place, and time.  Skin: Skin is warm.  Psychiatric: He has a normal mood and affect. His behavior is normal.     Assessment/Plan Plan for excision of left check cyst.  Peggye FormLAIRE S Tehya Leath, DO 11/02/2015, 11:19 AM

## 2015-11-06 NOTE — Anesthesia Preprocedure Evaluation (Addendum)
Anesthesia Evaluation  Patient identified by MRN, date of birth, ID band Patient awake    Reviewed: Allergy & Precautions, NPO status , Patient's Chart, lab work & pertinent test results  Airway Mallampati: II  TM Distance: >3 FB Neck ROM: Full    Dental no notable dental hx.    Pulmonary Current Smoker,    Pulmonary exam normal breath sounds clear to auscultation       Cardiovascular negative cardio ROS Normal cardiovascular exam Rhythm:Regular Rate:Normal     Neuro/Psych PSYCHIATRIC DISORDERS Bipolar Disorder negative neurological ROS     GI/Hepatic negative GI ROS, Neg liver ROS,   Endo/Other  negative endocrine ROS  Renal/GU negative Renal ROS     Musculoskeletal negative musculoskeletal ROS (+)   Abdominal   Peds  Hematology negative hematology ROS (+)   Anesthesia Other Findings   Reproductive/Obstetrics                            Anesthesia Physical Anesthesia Plan  ASA: I  Anesthesia Plan: General   Post-op Pain Management:    Induction: Intravenous  Airway Management Planned: LMA and Oral ETT  Additional Equipment:   Intra-op Plan:   Post-operative Plan: Extubation in OR  Informed Consent: I have reviewed the patients History and Physical, chart, labs and discussed the procedure including the risks, benefits and alternatives for the proposed anesthesia with the patient or authorized representative who has indicated his/her understanding and acceptance.   Dental advisory given  Plan Discussed with: CRNA  Anesthesia Plan Comments:         Anesthesia Quick Evaluation

## 2015-11-07 ENCOUNTER — Encounter (HOSPITAL_BASED_OUTPATIENT_CLINIC_OR_DEPARTMENT_OTHER)
Admission: RE | Disposition: A | Discharge status: Home / Self Care | Payer: Self-pay | Source: Ambulatory Visit | Attending: Plastic Surgery

## 2015-11-07 ENCOUNTER — Ambulatory Visit (HOSPITAL_BASED_OUTPATIENT_CLINIC_OR_DEPARTMENT_OTHER): Payer: Medicare Other | Admitting: Anesthesiology

## 2015-11-07 ENCOUNTER — Ambulatory Visit (HOSPITAL_BASED_OUTPATIENT_CLINIC_OR_DEPARTMENT_OTHER)
Admission: RE | Admit: 2015-11-07 | Discharge: 2015-11-07 | Disposition: A | Discharge status: Home / Self Care | Payer: Medicare Other | Source: Ambulatory Visit | Attending: Plastic Surgery | Admitting: Plastic Surgery

## 2015-11-07 ENCOUNTER — Encounter (HOSPITAL_BASED_OUTPATIENT_CLINIC_OR_DEPARTMENT_OTHER): Payer: Self-pay | Admitting: Anesthesiology

## 2015-11-07 DIAGNOSIS — L089 Local infection of the skin and subcutaneous tissue, unspecified: Secondary | ICD-10-CM | POA: Diagnosis not present

## 2015-11-07 DIAGNOSIS — F17210 Nicotine dependence, cigarettes, uncomplicated: Secondary | ICD-10-CM | POA: Diagnosis not present

## 2015-11-07 DIAGNOSIS — F17200 Nicotine dependence, unspecified, uncomplicated: Secondary | ICD-10-CM | POA: Insufficient documentation

## 2015-11-07 DIAGNOSIS — F319 Bipolar disorder, unspecified: Secondary | ICD-10-CM | POA: Insufficient documentation

## 2015-11-07 DIAGNOSIS — L723 Sebaceous cyst: Secondary | ICD-10-CM | POA: Insufficient documentation

## 2015-11-07 HISTORY — DX: Sebaceous cyst: L72.3

## 2015-11-07 HISTORY — DX: Other amnesia: R41.3

## 2015-11-07 HISTORY — DX: Personal history of other diseases of the digestive system: Z87.19

## 2015-11-07 HISTORY — PX: MASS EXCISION: SHX2000

## 2015-11-07 HISTORY — DX: Personal history of peptic ulcer disease: Z87.11

## 2015-11-07 SURGERY — EXCISION MASS
Anesthesia: General | Laterality: Left

## 2015-11-07 MED ORDER — ONDANSETRON HCL 4 MG/2ML IJ SOLN
INTRAMUSCULAR | Status: DC | PRN
  Administered 2015-11-07: 10 mg via INTRAVENOUS

## 2015-11-07 MED ORDER — DEXAMETHASONE SODIUM PHOSPHATE 10 MG/ML IJ SOLN
INTRAMUSCULAR | Status: AC
  Filled 2015-11-07: qty 1

## 2015-11-07 MED ORDER — CEFAZOLIN SODIUM-DEXTROSE 2-4 GM/100ML-% IV SOLN
2.0000 g | INTRAVENOUS | Status: AC
  Administered 2015-11-07: 2 g via INTRAVENOUS

## 2015-11-07 MED ORDER — EPINEPHRINE HCL 1 MG/ML IJ SOLN
INTRAMUSCULAR | Status: AC
Start: 2015-11-07 — End: 2015-11-07
  Filled 2015-11-07: qty 1

## 2015-11-07 MED ORDER — LACTATED RINGERS IV SOLN
INTRAVENOUS | Status: DC
  Administered 2015-11-07: 08:00:00 via INTRAVENOUS

## 2015-11-07 MED ORDER — OXYCODONE HCL 5 MG PO TABS: 5.0000 mg | ORAL_TABLET | Freq: Once | ORAL | Status: DC | PRN

## 2015-11-07 MED ORDER — OXYCODONE HCL 5 MG/5ML PO SOLN: 5.0000 mg | Freq: Once | ORAL | Status: DC | PRN

## 2015-11-07 MED ORDER — KETOROLAC TROMETHAMINE 30 MG/ML IJ SOLN: 30.0000 mg | Freq: Once | INTRAMUSCULAR | Status: DC | PRN

## 2015-11-07 MED ORDER — SUFENTANIL CITRATE 50 MCG/ML IV SOLN
INTRAVENOUS | Status: AC
  Filled 2015-11-07: qty 1

## 2015-11-07 MED ORDER — DEXAMETHASONE SODIUM PHOSPHATE 4 MG/ML IJ SOLN
INTRAMUSCULAR | Status: DC | PRN
  Administered 2015-11-07: 10 mg via INTRAVENOUS

## 2015-11-07 MED ORDER — BUPIVACAINE-EPINEPHRINE (PF) 0.25% -1:200000 IJ SOLN
INTRAMUSCULAR | Status: AC
  Filled 2015-11-07: qty 60

## 2015-11-07 MED ORDER — PROPOFOL 10 MG/ML IV BOLUS
INTRAVENOUS | Status: DC | PRN
  Administered 2015-11-07: 200 mg via INTRAVENOUS

## 2015-11-07 MED ORDER — LIDOCAINE HCL (CARDIAC) 20 MG/ML IV SOLN
INTRAVENOUS | Status: DC | PRN
  Administered 2015-11-07: 50 mg via INTRAVENOUS

## 2015-11-07 MED ORDER — LIDOCAINE 2% (20 MG/ML) 5 ML SYRINGE
INTRAMUSCULAR | Status: AC
  Filled 2015-11-07: qty 5

## 2015-11-07 MED ORDER — MEPERIDINE HCL 25 MG/ML IJ SOLN: 6.2500 mg | INTRAMUSCULAR | Status: DC | PRN

## 2015-11-07 MED ORDER — MIDAZOLAM HCL 2 MG/2ML IJ SOLN
INTRAMUSCULAR | Status: AC
  Filled 2015-11-07: qty 2

## 2015-11-07 MED ORDER — LIDOCAINE-EPINEPHRINE 1 %-1:100000 IJ SOLN
INTRAMUSCULAR | Status: AC
  Filled 2015-11-07: qty 2

## 2015-11-07 MED ORDER — GLYCOPYRROLATE 0.2 MG/ML IJ SOLN: 0.2000 mg | Freq: Once | INTRAMUSCULAR | Status: DC | PRN

## 2015-11-07 MED ORDER — SUFENTANIL CITRATE 50 MCG/ML IV SOLN
INTRAVENOUS | Status: DC | PRN
  Administered 2015-11-07: 10 ug via INTRAVENOUS

## 2015-11-07 MED ORDER — MIDAZOLAM HCL 2 MG/2ML IJ SOLN
1.0000 mg | INTRAMUSCULAR | Status: DC | PRN
  Administered 2015-11-07: 2 mg via INTRAVENOUS

## 2015-11-07 MED ORDER — BACITRACIN ZINC 500 UNIT/GM EX OINT
TOPICAL_OINTMENT | CUTANEOUS | Status: AC
  Filled 2015-11-07: qty 0.9

## 2015-11-07 MED ORDER — SUCCINYLCHOLINE CHLORIDE 200 MG/10ML IV SOSY
PREFILLED_SYRINGE | INTRAVENOUS | Status: AC
  Filled 2015-11-07: qty 10

## 2015-11-07 MED ORDER — ATROPINE SULFATE 0.4 MG/ML IJ SOLN
INTRAMUSCULAR | Status: AC
  Filled 2015-11-07: qty 1

## 2015-11-07 MED ORDER — SCOPOLAMINE 1 MG/3DAYS TD PT72: 1.0000 | MEDICATED_PATCH | Freq: Once | TRANSDERMAL | Status: DC | PRN

## 2015-11-07 MED ORDER — ONDANSETRON HCL 4 MG/2ML IJ SOLN
INTRAMUSCULAR | Status: AC
  Filled 2015-11-07: qty 2

## 2015-11-07 MED ORDER — PHENYLEPHRINE 40 MCG/ML (10ML) SYRINGE FOR IV PUSH (FOR BLOOD PRESSURE SUPPORT)
PREFILLED_SYRINGE | INTRAVENOUS | Status: AC
  Filled 2015-11-07: qty 10

## 2015-11-07 MED ORDER — EPHEDRINE 5 MG/ML INJ
INTRAVENOUS | Status: AC
  Filled 2015-11-07: qty 10

## 2015-11-07 MED ORDER — PROMETHAZINE HCL 25 MG/ML IJ SOLN: 6.2500 mg | INTRAMUSCULAR | Status: DC | PRN

## 2015-11-07 MED ORDER — FENTANYL CITRATE (PF) 100 MCG/2ML IJ SOLN: 50.0000 ug | INTRAMUSCULAR | Status: DC | PRN

## 2015-11-07 MED ORDER — LIDOCAINE-EPINEPHRINE 1 %-1:100000 IJ SOLN
INTRAMUSCULAR | Status: DC | PRN
  Administered 2015-11-07: 1 mL

## 2015-11-07 MED ORDER — LIDOCAINE HCL (PF) 1 % IJ SOLN
INTRAMUSCULAR | Status: AC
  Filled 2015-11-07: qty 90

## 2015-11-07 MED ORDER — HYDROMORPHONE HCL 1 MG/ML IJ SOLN: 0.2500 mg | INTRAMUSCULAR | Status: DC | PRN

## 2015-11-07 MED ORDER — PROPOFOL 500 MG/50ML IV EMUL
INTRAVENOUS | Status: AC
  Filled 2015-11-07: qty 50

## 2015-11-07 SURGICAL SUPPLY — 73 items
ADH SKN CLS APL DERMABOND .7 (GAUZE/BANDAGES/DRESSINGS) ×1
APL SKNCLS STERI-STRIP NONHPOA (GAUZE/BANDAGES/DRESSINGS)
BENZOIN TINCTURE PRP APPL 2/3 (GAUZE/BANDAGES/DRESSINGS) IMPLANT
BLADE CLIPPER SURG (BLADE) IMPLANT
BLADE SURG 15 STRL LF DISP TIS (BLADE) ×1 IMPLANT
BLADE SURG 15 STRL SS (BLADE) ×3
BNDG CONFORM 2 STRL LF (GAUZE/BANDAGES/DRESSINGS) IMPLANT
BNDG ELASTIC 2X5.8 VLCR STR LF (GAUZE/BANDAGES/DRESSINGS) IMPLANT
CANISTER SUCT 1200ML W/VALVE (MISCELLANEOUS) IMPLANT
CHLORAPREP W/TINT 26ML (MISCELLANEOUS) IMPLANT
CLEANER CAUTERY TIP 5X5 PAD (MISCELLANEOUS) IMPLANT
CLOSURE WOUND 1/2 X4 (GAUZE/BANDAGES/DRESSINGS)
CORDS BIPOLAR (ELECTRODE) IMPLANT
COVER BACK TABLE 60X90IN (DRAPES) ×3 IMPLANT
COVER MAYO STAND STRL (DRAPES) ×3 IMPLANT
DECANTER SPIKE VIAL GLASS SM (MISCELLANEOUS) IMPLANT
DERMABOND ADVANCED (GAUZE/BANDAGES/DRESSINGS) ×2
DERMABOND ADVANCED .7 DNX12 (GAUZE/BANDAGES/DRESSINGS) IMPLANT
DRAPE LAPAROTOMY 100X72 PEDS (DRAPES) IMPLANT
DRAPE U-SHAPE 76X120 STRL (DRAPES) ×3 IMPLANT
DRSG TEGADERM 2-3/8X2-3/4 SM (GAUZE/BANDAGES/DRESSINGS) IMPLANT
DRSG TEGADERM 4X4.75 (GAUZE/BANDAGES/DRESSINGS) IMPLANT
ELECT COATED BLADE 2.86 ST (ELECTRODE) IMPLANT
ELECT NDL BLADE 2-5/6 (NEEDLE) IMPLANT
ELECT NEEDLE BLADE 2-5/6 (NEEDLE) ×3 IMPLANT
ELECT REM PT RETURN 9FT ADLT (ELECTROSURGICAL)
ELECT REM PT RETURN 9FT PED (ELECTROSURGICAL)
ELECTRODE REM PT RETRN 9FT PED (ELECTROSURGICAL) IMPLANT
ELECTRODE REM PT RTRN 9FT ADLT (ELECTROSURGICAL) IMPLANT
GLOVE BIO SURGEON STRL SZ 6.5 (GLOVE) ×4 IMPLANT
GLOVE BIO SURGEONS STRL SZ 6.5 (GLOVE) ×2
GLOVE BIOGEL PI IND STRL 7.0 (GLOVE) IMPLANT
GLOVE BIOGEL PI INDICATOR 7.0 (GLOVE) ×2
GLOVE ECLIPSE 6.5 STRL STRAW (GLOVE) ×2 IMPLANT
GOWN STRL REUS W/ TWL LRG LVL3 (GOWN DISPOSABLE) ×2 IMPLANT
GOWN STRL REUS W/TWL LRG LVL3 (GOWN DISPOSABLE) ×6
LIQUID BAND (GAUZE/BANDAGES/DRESSINGS) IMPLANT
NDL HYPO 30GX1 BEV (NEEDLE) IMPLANT
NEEDLE HYPO 30GX1 BEV (NEEDLE) IMPLANT
NEEDLE PRECISIONGLIDE 27X1.5 (NEEDLE) ×3 IMPLANT
NS IRRIG 1000ML POUR BTL (IV SOLUTION) IMPLANT
PACK BASIN DAY SURGERY FS (CUSTOM PROCEDURE TRAY) ×3 IMPLANT
PAD CLEANER CAUTERY TIP 5X5 (MISCELLANEOUS)
PENCIL BUTTON HOLSTER BLD 10FT (ELECTRODE) ×2 IMPLANT
RUBBERBAND STERILE (MISCELLANEOUS) IMPLANT
SHEET MEDIUM DRAPE 40X70 STRL (DRAPES) IMPLANT
SLEEVE SCD COMPRESS KNEE MED (MISCELLANEOUS) IMPLANT
SPONGE GAUZE 2X2 8PLY STER LF (GAUZE/BANDAGES/DRESSINGS)
SPONGE GAUZE 2X2 8PLY STRL LF (GAUZE/BANDAGES/DRESSINGS) IMPLANT
SPONGE GAUZE 4X4 12PLY STER LF (GAUZE/BANDAGES/DRESSINGS) IMPLANT
STRIP CLOSURE SKIN 1/2X4 (GAUZE/BANDAGES/DRESSINGS) IMPLANT
STRIP SUTURE WOUND CLOSURE 1/2 (SUTURE) ×2 IMPLANT
SUCTION FRAZIER HANDLE 10FR (MISCELLANEOUS)
SUCTION TUBE FRAZIER 10FR DISP (MISCELLANEOUS) IMPLANT
SUT MNCRL 6-0 UNDY P1 1X18 (SUTURE) IMPLANT
SUT MNCRL AB 3-0 PS2 18 (SUTURE) IMPLANT
SUT MNCRL AB 4-0 PS2 18 (SUTURE) IMPLANT
SUT MON AB 5-0 P3 18 (SUTURE) ×2 IMPLANT
SUT MON AB 5-0 PS2 18 (SUTURE) IMPLANT
SUT MONOCRYL 6-0 P1 1X18 (SUTURE)
SUT PROLENE 5 0 P 3 (SUTURE) IMPLANT
SUT PROLENE 5 0 PS 2 (SUTURE) IMPLANT
SUT PROLENE 6 0 P 1 18 (SUTURE) IMPLANT
SUT VIC AB 5-0 P-3 18X BRD (SUTURE) IMPLANT
SUT VIC AB 5-0 P3 18 (SUTURE)
SUT VIC AB 5-0 PS2 18 (SUTURE) IMPLANT
SUT VICRYL 4-0 PS2 18IN ABS (SUTURE) ×2 IMPLANT
SYR BULB 3OZ (MISCELLANEOUS) IMPLANT
SYR CONTROL 10ML LL (SYRINGE) ×3 IMPLANT
TOWEL OR 17X24 6PK STRL BLUE (TOWEL DISPOSABLE) ×3 IMPLANT
TRAY DSU PREP LF (CUSTOM PROCEDURE TRAY) ×3 IMPLANT
TUBE CONNECTING 20'X1/4 (TUBING) ×1
TUBE CONNECTING 20X1/4 (TUBING) ×1 IMPLANT

## 2015-11-07 NOTE — Interval H&P Note (Signed)
History and Physical Interval Note:  11/07/2015 8:14 AM  [name] [name]  has presented today for surgery, with the diagnosis of SEBACEOUS CYST ON LEFT CHEEK  The various methods of treatment have been discussed with the patient and family. After consideration of risks, benefits and other options for treatment, the patient has consented to  Procedure(s): EXCISION OF LEFT CHEEK CYST (Left) as a surgical intervention .  The patient's history has been reviewed, patient examined, no change in status, stable for surgery.  I have reviewed the patient's chart and labs.  Questions were answered to the patient's satisfaction.     Peggye FormLAIRE S Makenze Ellett

## 2015-11-07 NOTE — Anesthesia Procedure Notes (Signed)
Procedure Name: LMA Insertion Date/Time: 11/07/2015 8:31 AM Performed by: Zenia ResidesPAYNE, Alvah Lagrow D Pre-anesthesia Checklist: Patient identified, Emergency Drugs available, Suction available and Patient being monitored Patient Re-evaluated:Patient Re-evaluated prior to inductionOxygen Delivery Method: Circle System Utilized Preoxygenation: Pre-oxygenation with 100% oxygen Intubation Type: IV induction Ventilation: Mask ventilation without difficulty LMA: LMA inserted LMA Size: 5.0 Number of attempts: 1 Airway Equipment and Method: Bite block Placement Confirmation: positive ETCO2 Tube secured with: Tape Dental Injury: Teeth and Oropharynx as per pre-operative assessment

## 2015-11-07 NOTE — Transfer of Care (Signed)
Immediate Anesthesia Transfer of Care Note  Patient: [name] [name]  Procedure(s) Performed: Procedure(s): EXCISION OF LEFT CHEEK CYST (Left)  Patient Location: PACU  Anesthesia Type:General  Level of Consciousness: sedated  Airway & Oxygen Therapy: Patient Spontanous Breathing and Patient connected to face mask oxygen  Post-op Assessment: Report given to RN and Post -op Vital signs reviewed and stable  Post vital signs: Reviewed and stable  Last Vitals:  Filed Vitals:   11/07/15 0741  BP: 112/55  Pulse: 52  Temp: 36.7 C  Resp: 16    Last Pain: There were no vitals filed for this visit.       Complications: No apparent anesthesia complications

## 2015-11-07 NOTE — Brief Op Note (Signed)
11/07/2015  8:59 AM  PATIENT:  [name] [name]  25 y.o. male  PRE-OPERATIVE DIAGNOSIS:  SEBACEOUS CYST ON LEFT CHEEK  POST-OPERATIVE DIAGNOSIS:  SEBACEOUS CYST ON LEFT CHEEK  PROCEDURE:  Procedure(s): EXCISION OF LEFT CHEEK CYST (Left)  SURGEON:  Surgeon(s) and Role:    * Alena Billslaire S Jahmeek Shirk, DO - Primary  PHYSICIAN ASSISTANT: [name] Rayburn, PA  ASSISTANTS: none   ANESTHESIA:   general  EBL:     BLOOD ADMINISTERED:none  DRAINS: none   LOCAL MEDICATIONS USED:  LIDOCAINE   SPECIMEN:  Source of Specimen:  left cheek lesion  DISPOSITION OF SPECIMEN:  PATHOLOGY  COUNTS:  YES  TOURNIQUET:  * No tourniquets in log *  DICTATION: .Dragon Dictation  PLAN OF CARE: Discharge to home after PACU  PATIENT DISPOSITION:  PACU - hemodynamically stable.   Delay start of Pharmacological VTE agent (>24hrs) due to surgical blood loss or risk of bleeding: no

## 2015-11-07 NOTE — H&P (View-Only) (Signed)
[name] [name] is an 24 y.o. male.   Chief Complaint: cheek cyst HPI: The patient is a 24 yrs old bm here for a pre operative history and physical prior to excision of a cyst on his left cheek area. He states that it has been present for years. It gets big and then pops. Recently he went to the ED and had it drained. The area gets red and very tender. It was ~ 4 cm in size. It is now no red but slightly tender and ~ 1 cm with a hard core. He does not have any sign of infection or enlarge lymph nodes. He would like to have it remove so it does not continue to return. He is otherwise healthy and does not have any medical conditions. He is on medication for Bipolar I disorder.  Past Medical History  Diagnosis Date  . Bipolar 1 disorder (HCC)   . Impaired memory   . Sebaceous cyst 10/2015    left cheek (face)  . History of gastric ulcer     Past Surgical History  Procedure Laterality Date  . No past surgeries      Family History  Problem Relation Age of Onset  . Hyperlipidemia Mother   . Hypertension Mother   . Depression Mother   . Osteoporosis Mother   . Diabetes Maternal Grandfather   . Heart disease Maternal Grandfather    Social History:  reports that he has been smoking Cigarettes.  He has smoked for the past 5 years. He has never used smokeless tobacco. He reports that he does not drink alcohol or use illicit drugs.  Allergies: No Known Allergies   (Not in a hospital admission)  No results found for this or any previous visit (from the past 48 hour(s)). No results found.  Review of Systems  Constitutional: Negative.   HENT: Negative.   Eyes: Negative.   Respiratory: Negative.   Cardiovascular: Negative.   Gastrointestinal: Negative.   Genitourinary: Negative.   Musculoskeletal: Negative.  Negative for neck pain.  Skin: Negative.   Neurological: Negative.   Psychiatric/Behavioral: Negative.     There were no vitals taken for this visit. Physical Exam    Constitutional: He is oriented to person, place, and time. He appears well-developed and well-nourished.  HENT:  Head: Normocephalic and atraumatic.    Eyes: Conjunctivae and EOM are normal. Pupils are equal, round, and reactive to light.  Cardiovascular: Normal rate.   Respiratory: Effort normal.  GI: Soft.  Neurological: He is alert and oriented to person, place, and time.  Skin: Skin is warm.  Psychiatric: He has a normal mood and affect. His behavior is normal.     Assessment/Plan Plan for excision of left check cyst.  Stefana Lodico S Sway Guttierrez, DO 11/02/2015, 11:19 AM    

## 2015-11-07 NOTE — Anesthesia Postprocedure Evaluation (Signed)
Anesthesia Post Note  Patient: [name] ArgyleChristopher L [name]  Procedure(s) Performed: Procedure(s) (LRB): EXCISION OF LEFT CHEEK CYST (Left)  Patient location during evaluation: PACU Anesthesia Type: General Level of consciousness: sedated and patient cooperative Pain management: pain level controlled Vital Signs Assessment: post-procedure vital signs reviewed and stable Respiratory status: spontaneous breathing Cardiovascular status: stable Anesthetic complications: no    Last Vitals:  Filed Vitals:   11/07/15 0945 11/07/15 1011  BP: 129/78 130/76  Pulse: 70 66  Temp:  36.4 C  Resp: 15 18    Last Pain:  Filed Vitals:   11/07/15 1013  PainSc: 0-No pain                 Lewie LoronJohn Mischa Pollard

## 2015-11-07 NOTE — Op Note (Signed)
Operative Note   DATE OF OPERATION: 11/07/2015  [facility]   SURGICAL DIVISION: Plastic Surgery  PREOPERATIVE DIAGNOSES:  Left cheek sebaceous cyst  POSTOPERATIVE DIAGNOSES:  same  PROCEDURE:  Excision of left cheek sebaceous cyst 2 cm with layered closure  SURGEON: Claire Sanger Dillingham, DO  ASSISTANT: [name] Rayburn, PA  ANESTHESIA:  General.   COMPLICATIONS: None.   INDICATIONS FOR PROCEDURE:  The patient, [name] [name] is a 25 y.o. male born on 1990-12-25, is here for treatment of a left cheek cyst that was infected and treated from the ED with antibiotics. MRN: [id number]  CONSENT:  Informed consent was obtained directly from the patient. Risks, benefits and alternatives were fully discussed. Specific risks including but not limited to bleeding, infection, hematoma, seroma, scarring, pain, infection, contracture, asymmetry, wound healing problems, and need for further surgery were all discussed. The patient did have an ample opportunity to have questions answered to satisfaction.   DESCRIPTION OF PROCEDURE:  The patient was taken to the operating room. SCDs were placed and IV antibiotics were given. The patient's operative site was prepped and draped in a sterile fashion. A time out was performed and all information was confirmed to be correct.  General anesthesia was administered.  The area was injected with local.  After waiting for several minutes for the local to take effect the #15 blade was used.  A small elliptical incision was made over the left cheek cyst.  The scissors were used to dissect down and remove the capsule completely 2 cm.  The bleeding at the base was controlled with electrocautery.  The deep layer was closed with 5-0 Monocryl followed by a running subcuticular Monocryl.   Dermabond and steri strips were placed. The patient tolerated the procedure well.  There were no complications. The patient was allowed to wake from  anesthesia, extubated and taken to the recovery room in satisfactory condition. The specimen was sent to pathology.

## 2015-11-07 NOTE — Discharge Instructions (Signed)
Keep clean May shower tomorrow Leave steri strip in place Replace strip if it falls off    Post Anesthesia Home Care Instructions  Activity: Get plenty of rest for the remainder of the day. A responsible adult should stay with you for 24 hours following the procedure.  For the next 24 hours, DO NOT: -Drive a car -Advertising copywriterperate machinery -Drink alcoholic beverages -Take any medication unless instructed by your physician -Make any legal decisions or sign important papers.  Meals: Start with liquid foods such as gelatin or soup. Progress to regular foods as tolerated. Avoid greasy, spicy, heavy foods. If nausea and/or vomiting occur, drink only clear liquids until the nausea and/or vomiting subsides. Call your physician if vomiting continues.  Special Instructions/Symptoms: Your throat may feel dry or sore from the anesthesia or the breathing tube placed in your throat during surgery. If this causes discomfort, gargle with warm salt water. The discomfort should disappear within 24 hours.  If you had a scopolamine patch placed behind your ear for the management of post- operative nausea and/or vomiting:  1. The medication in the patch is effective for 72 hours, after which it should be removed.  Wrap patch in a tissue and discard in the trash. Wash hands thoroughly with soap and water. 2. You may remove the patch earlier than 72 hours if you experience unpleasant side effects which may include dry mouth, dizziness or visual disturbances. 3. Avoid touching the patch. Wash your hands with soap and water after contact with the patch.

## 2015-11-08 ENCOUNTER — Encounter (HOSPITAL_BASED_OUTPATIENT_CLINIC_OR_DEPARTMENT_OTHER): Payer: Self-pay | Admitting: Plastic Surgery

## 2015-11-16 DIAGNOSIS — L723 Sebaceous cyst: Secondary | ICD-10-CM | POA: Diagnosis not present

## 2016-01-25 DIAGNOSIS — F250 Schizoaffective disorder, bipolar type: Secondary | ICD-10-CM | POA: Diagnosis not present

## 2016-05-07 ENCOUNTER — Other Ambulatory Visit (HOSPITAL_COMMUNITY)
Admission: RE | Admit: 2016-05-07 | Discharge: 2016-05-07 | Disposition: A | Discharge status: Home / Self Care | Payer: Medicare Other | Source: Ambulatory Visit | Attending: Family Medicine | Admitting: Family Medicine

## 2016-05-07 ENCOUNTER — Encounter: Payer: Self-pay | Admitting: Internal Medicine

## 2016-05-07 ENCOUNTER — Ambulatory Visit (INDEPENDENT_AMBULATORY_CARE_PROVIDER_SITE_OTHER): Payer: Medicare Other | Admitting: Internal Medicine

## 2016-05-07 ENCOUNTER — Ambulatory Visit: Payer: Self-pay | Admitting: Family Medicine

## 2016-05-07 VITALS — BP 156/94 | HR 111 | Temp 98.0°F | Wt 183.0 lb

## 2016-05-07 DIAGNOSIS — R300 Dysuria: Secondary | ICD-10-CM

## 2016-05-07 DIAGNOSIS — Z113 Encounter for screening for infections with a predominantly sexual mode of transmission: Secondary | ICD-10-CM | POA: Diagnosis not present

## 2016-05-07 DIAGNOSIS — Z114 Encounter for screening for human immunodeficiency virus [HIV]: Secondary | ICD-10-CM | POA: Diagnosis not present

## 2016-05-07 DIAGNOSIS — R309 Painful micturition, unspecified: Secondary | ICD-10-CM

## 2016-05-07 LAB — POCT URINALYSIS DIPSTICK
Bilirubin, UA: NEGATIVE
Glucose, UA: NEGATIVE
Ketones, UA: NEGATIVE
Leukocytes, UA: NEGATIVE
NITRITE UA: NEGATIVE
PH UA: 6.5
Protein, UA: NEGATIVE
UROBILINOGEN UA: 0.2

## 2016-05-07 LAB — POCT UA - MICROSCOPIC ONLY

## 2016-05-07 NOTE — Patient Instructions (Addendum)
It was nice meeting you today Mr. [name] [name]!  We will call you with the results of your testing within the next couple days.   If you have any questions or concerns, please feel free to call the clinic.   Be well,  Dr. Natale MilchLancaster

## 2016-05-07 NOTE — Assessment & Plan Note (Signed)
Now chronic as occurring for past 4 months. Without associated symptoms, focal or systemic. UA neg for infection. Given unprotected intercourse, STD is possible and would explain symptoms.  - GC/chlamydia, HIV, RPR pending - Will call patient at his mobile number (he is very concerned about his mother finding out that he is concerned about STDs) when results are available

## 2016-05-07 NOTE — Progress Notes (Signed)
   Subjective:    Patient ID: [name] [name], male    DOB: [birth year], 27 y.o.   MRN: [id number]  HPI  Patient presents for same day appointment for painful urination.  Dysuria Began 3-4 months ago. Describes sensation as a pinching or stinging feeling immediately before urinating. Occurs almost every time he urinates. Denies hematuria, increased frequency, nausea, vomiting, abdominal pain, fevers, chills. Denies rashes but does endorse a boil under his scrotum a few months ago that has resolved. Says that he thinks his urinary stream may be a little weaker than usual. Denies penile discharge. Endorses oral sex with a new sexual partner recently.    Smoking status reviewed.   Review of Systems See HPI.     Objective:   Physical Exam  Constitutional: He is oriented to person, place, and time. He appears well-developed and well-nourished. No distress.  HENT:  Head: Normocephalic and atraumatic.  Eyes: Conjunctivae and EOM are normal. Right eye exhibits no discharge. Left eye exhibits no discharge.  Pulmonary/Chest: Effort normal. No respiratory distress.  Neurological: He is alert and oriented to person, place, and time.  Psychiatric: He has a normal mood and affect. His behavior is normal.  Vitals reviewed.     Assessment & Plan:  Dysuria Now chronic as occurring for past 4 months. Without associated symptoms, focal or systemic. UA neg for infection. Given unprotected intercourse, STD is possible and would explain symptoms.  - GC/chlamydia, HIV, RPR pending - Will call patient at his mobile number (he is very concerned about his mother finding out that he is concerned about STDs) when results are available  Tarri AbernethyAbigail J Dekota Kirlin, MD, MPH PGY-2 [facility] Pager [phone]

## 2016-05-08 ENCOUNTER — Telehealth: Payer: Self-pay | Admitting: Family Medicine

## 2016-05-08 LAB — URINE CYTOLOGY ANCILLARY ONLY
CHLAMYDIA, DNA PROBE: NEGATIVE
NEISSERIA GONORRHEA: NEGATIVE

## 2016-05-08 LAB — RPR

## 2016-05-08 LAB — HIV ANTIBODY (ROUTINE TESTING W REFLEX): HIV: NONREACTIVE

## 2016-05-08 NOTE — Telephone Encounter (Signed)
Pt would like someone to contact him ASAP about lab results. Please advise. Thanks! ep

## 2016-05-09 ENCOUNTER — Telehealth: Payer: Self-pay | Admitting: Internal Medicine

## 2016-05-09 NOTE — Telephone Encounter (Signed)
Pt informed of normal lab results. Deseree Blount, CMA  

## 2016-05-09 NOTE — Telephone Encounter (Signed)
Called to inform patient of negative STD screens. Patient appreciative and voiced understanding.   Tarri AbernethyAbigail J Breona Cherubin, MD, MPH PGY-2 [facility] Pager [phone]

## 2016-06-05 DIAGNOSIS — F250 Schizoaffective disorder, bipolar type: Secondary | ICD-10-CM | POA: Diagnosis not present

## 2016-07-24 DIAGNOSIS — F250 Schizoaffective disorder, bipolar type: Secondary | ICD-10-CM | POA: Diagnosis not present

## 2016-07-25 ENCOUNTER — Ambulatory Visit: Payer: Self-pay

## 2016-10-29 DIAGNOSIS — F250 Schizoaffective disorder, bipolar type: Secondary | ICD-10-CM | POA: Diagnosis not present

## 2017-01-22 DIAGNOSIS — F250 Schizoaffective disorder, bipolar type: Secondary | ICD-10-CM | POA: Diagnosis not present

## 2017-03-12 ENCOUNTER — Ambulatory Visit: Payer: Self-pay | Admitting: Internal Medicine

## 2017-05-13 DIAGNOSIS — F250 Schizoaffective disorder, bipolar type: Secondary | ICD-10-CM | POA: Diagnosis not present

## 2017-07-30 DIAGNOSIS — F250 Schizoaffective disorder, bipolar type: Secondary | ICD-10-CM | POA: Diagnosis not present

## 2017-11-18 DIAGNOSIS — F250 Schizoaffective disorder, bipolar type: Secondary | ICD-10-CM | POA: Diagnosis not present

## 2018-02-11 DIAGNOSIS — F250 Schizoaffective disorder, bipolar type: Secondary | ICD-10-CM | POA: Diagnosis not present

## 2018-10-22 ENCOUNTER — Other Ambulatory Visit: Payer: Self-pay

## 2018-10-22 ENCOUNTER — Emergency Department (HOSPITAL_COMMUNITY)
Admission: EM | Admit: 2018-10-22 | Discharge: 2018-10-22 | Disposition: A | Discharge status: Home / Self Care | Payer: No Typology Code available for payment source | Attending: Emergency Medicine | Admitting: Emergency Medicine

## 2018-10-22 ENCOUNTER — Encounter (HOSPITAL_COMMUNITY): Payer: Self-pay | Admitting: Emergency Medicine

## 2018-10-22 ENCOUNTER — Emergency Department (HOSPITAL_COMMUNITY): Payer: No Typology Code available for payment source

## 2018-10-22 DIAGNOSIS — S299XXA Unspecified injury of thorax, initial encounter: Secondary | ICD-10-CM | POA: Diagnosis not present

## 2018-10-22 DIAGNOSIS — M545 Low back pain, unspecified: Secondary | ICD-10-CM

## 2018-10-22 DIAGNOSIS — F17210 Nicotine dependence, cigarettes, uncomplicated: Secondary | ICD-10-CM | POA: Diagnosis not present

## 2018-10-22 DIAGNOSIS — M546 Pain in thoracic spine: Secondary | ICD-10-CM | POA: Diagnosis not present

## 2018-10-22 DIAGNOSIS — Z79899 Other long term (current) drug therapy: Secondary | ICD-10-CM | POA: Diagnosis not present

## 2018-10-22 DIAGNOSIS — S3992XA Unspecified injury of lower back, initial encounter: Secondary | ICD-10-CM | POA: Diagnosis not present

## 2018-10-22 DIAGNOSIS — F319 Bipolar disorder, unspecified: Secondary | ICD-10-CM | POA: Diagnosis not present

## 2018-10-22 DIAGNOSIS — R0781 Pleurodynia: Secondary | ICD-10-CM | POA: Diagnosis not present

## 2018-10-22 MED ORDER — CYCLOBENZAPRINE HCL 10 MG PO TABS: 10.0000 mg | ORAL_TABLET | Freq: Two times a day (BID) | ORAL | 0 refills | Status: DC | PRN

## 2018-10-22 NOTE — ED Notes (Signed)
Pt back from X-ray.  
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
## 2018-10-22 NOTE — Discharge Instructions (Addendum)
Your x-rays did not show any sign of broken bones or punctured lungs.  You can alternate 400-600 mg of ibuprofen and [phone] mg of Tylenol every 3 hours as needed for pain. Do not exceed 4000 mg of Tylenol daily.  Take ibuprofen with food to avoid upset stomach issues.  You may take Flexeril up to twice daily as needed for muscle spasms. This medication may make you drowsy, so I typically only recommended at night. If this medication makes you drowsy throughout the day, no driving, drinking alcohol, or operating heavy machinery. You may also cut these tablets in half. Ice to areas of soreness for the next few days and then may move to heat. Do some gentle stretching throughout the day, especially during hot showers or baths. Take short frequent walks and avoid prolonged periods of sitting or laying. Expect to be sore for the next few day and follow up with primary care physician for recheck of ongoing symptoms but return to ER for emergent changing or worsening of symptoms such as severe headache that gets worse, altered mental status/behaving unusually, persistent vomiting, excessive drowsiness, numbness to the arms or legs, unsteady gait, or slurred speech.

## 2018-11-09 DIAGNOSIS — F250 Schizoaffective disorder, bipolar type: Secondary | ICD-10-CM | POA: Diagnosis not present

## 2019-01-28 DIAGNOSIS — F250 Schizoaffective disorder, bipolar type: Secondary | ICD-10-CM | POA: Diagnosis not present

## 2019-03-15 DIAGNOSIS — Z23 Encounter for immunization: Secondary | ICD-10-CM | POA: Diagnosis not present

## 2019-03-16 ENCOUNTER — Ambulatory Visit: Payer: Medicare Other

## 2019-04-22 DIAGNOSIS — F250 Schizoaffective disorder, bipolar type: Secondary | ICD-10-CM | POA: Diagnosis not present

## 2019-04-22 DIAGNOSIS — F419 Anxiety disorder, unspecified: Secondary | ICD-10-CM | POA: Diagnosis not present

## 2019-05-04 ENCOUNTER — Other Ambulatory Visit: Payer: Self-pay

## 2019-05-04 DIAGNOSIS — Z20822 Contact with and (suspected) exposure to covid-19: Secondary | ICD-10-CM

## 2019-05-05 LAB — NOVEL CORONAVIRUS, NAA: SARS-CoV-2, NAA: NOT DETECTED

## 2019-08-15 DIAGNOSIS — F419 Anxiety disorder, unspecified: Secondary | ICD-10-CM | POA: Diagnosis not present

## 2019-08-15 DIAGNOSIS — F250 Schizoaffective disorder, bipolar type: Secondary | ICD-10-CM | POA: Diagnosis not present
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
## 2019-11-08 DIAGNOSIS — F250 Schizoaffective disorder, bipolar type: Secondary | ICD-10-CM | POA: Diagnosis not present

## 2019-11-08 DIAGNOSIS — F419 Anxiety disorder, unspecified: Secondary | ICD-10-CM | POA: Diagnosis not present

## 2020-01-31 DIAGNOSIS — F419 Anxiety disorder, unspecified: Secondary | ICD-10-CM | POA: Diagnosis not present

## 2020-01-31 DIAGNOSIS — F250 Schizoaffective disorder, bipolar type: Secondary | ICD-10-CM | POA: Diagnosis not present

## 2020-05-09 ENCOUNTER — Ambulatory Visit: Payer: Medicare Other

## 2020-05-10 ENCOUNTER — Ambulatory Visit (INDEPENDENT_AMBULATORY_CARE_PROVIDER_SITE_OTHER): Payer: Medicare Other

## 2020-05-10 ENCOUNTER — Other Ambulatory Visit: Payer: Self-pay

## 2020-05-10 DIAGNOSIS — Z23 Encounter for immunization: Secondary | ICD-10-CM | POA: Diagnosis not present

## 2020-05-10 NOTE — Progress Notes (Signed)
Patient presents in nurse clinic for flu vaccine.   Vaccine administered LD without complication.   See admin for details.  

## 2020-05-16 ENCOUNTER — Ambulatory Visit (INDEPENDENT_AMBULATORY_CARE_PROVIDER_SITE_OTHER): Payer: Medicare Other

## 2020-05-16 ENCOUNTER — Other Ambulatory Visit: Payer: Self-pay

## 2020-05-16 DIAGNOSIS — Z23 Encounter for immunization: Secondary | ICD-10-CM | POA: Diagnosis not present

## 2020-05-16 NOTE — Progress Notes (Signed)
° °  Covid-19 Vaccination Clinic  Name:  ISAC LINCKS    MRN: [id number] DOB: 1990-11-18  05/16/2020  Mr. Doublin was observed post Covid-19 immunization for 15 minutes without incident. He was provided with Vaccine Information Sheet and instruction to access the V-Safe system.   Mr. Martis was instructed to call 911 with any severe reactions post vaccine:  Difficulty breathing   Swelling of face and throat   A fast heartbeat   A bad rash all over body   Dizziness and weakness   Booster administered LD without complication.

## 2020-05-23 DIAGNOSIS — F250 Schizoaffective disorder, bipolar type: Secondary | ICD-10-CM | POA: Diagnosis not present

## 2020-05-23 DIAGNOSIS — F419 Anxiety disorder, unspecified: Secondary | ICD-10-CM | POA: Diagnosis not present

## 2020-08-16 DIAGNOSIS — F419 Anxiety disorder, unspecified: Secondary | ICD-10-CM | POA: Diagnosis not present

## 2020-08-16 DIAGNOSIS — F250 Schizoaffective disorder, bipolar type: Secondary | ICD-10-CM | POA: Diagnosis not present

## 2020-08-30 NOTE — Progress Notes (Signed)
    SUBJECTIVE:   CHIEF COMPLAINT / HPI: physical   Tobacco Use: Smoking 3 cigs per day  He has been able to cut down from 6 cigarettes per day  Patient would like to quit entirely  He has not set a quit date but does desire to quit by 2023  Patient reports he does not think a patch will help however he is open to trying gum/lozenges to help with quitting   BPD Follows with behavior health  Currently on Abilify, reports adherence and sees "Mr Merlyn Albert" at [facility] with last appt 3 weeks ago.   HM  Patient is recommended for Hep C screening, tetanus and covid vaccination.  Agreeable to hep c screening today.  Last covid vaccine 05/16/20 (reports having booster but is not sure where his vaccine card is)  PERTINENT  PMH / PSH:  Bipolar disorder  OBJECTIVE:   BP 115/80   Pulse 95   Ht 6\' 2"  (1.88 m)   Wt 208 lb 6.4 oz (94.5 kg)   SpO2 98%   BMI 26.76 kg/m   General: male appearing stated age in no acute distress HEENT: MMM, no oral lesions noted,Neck non-tender without lymphadenopathy Cardio: Normal S1 and S2, no S3 or S4. Rhythm is regular. No murmurs or rubs.  Bilateral radial pulses palpable Pulm: Clear to auscultation bilaterally, no crackles, wheezing, or diminished breath sounds. Normal respiratory effort Abdomen: Bowel sounds normal. Abdomen soft and non-tender.  Extremities: No peripheral edema. Warm/ well perfused.  Neuro: pt alert and oriented x4   ASSESSMENT/PLAN:   Bipolar affective disorder, current episode moderate Reports adherence and controlled symptoms on Abilify  Follows with [facility]  CBC and CMP   Healthcare maintenance Hep C screening  Will need covid vaccine card to update doses, patient reports three total doses   Tobacco abuse Currently smoking 3 cigarettes per day  Patient would like to stop smoking  Will prescribe lozenges and f/u to continue with cessation efforts       , MD [facility]

## 2020-08-31 ENCOUNTER — Ambulatory Visit (INDEPENDENT_AMBULATORY_CARE_PROVIDER_SITE_OTHER): Payer: Medicare Other | Admitting: Family Medicine

## 2020-08-31 ENCOUNTER — Other Ambulatory Visit: Payer: Self-pay

## 2020-08-31 ENCOUNTER — Encounter: Payer: Self-pay | Admitting: Family Medicine

## 2020-08-31 VITALS — BP 115/80 | HR 95 | Ht 74.0 in | Wt 208.4 lb

## 2020-08-31 DIAGNOSIS — Z79899 Other long term (current) drug therapy: Secondary | ICD-10-CM

## 2020-08-31 DIAGNOSIS — Z720 Tobacco use: Secondary | ICD-10-CM

## 2020-08-31 DIAGNOSIS — Z1159 Encounter for screening for other viral diseases: Secondary | ICD-10-CM

## 2020-08-31 DIAGNOSIS — Z6826 Body mass index (BMI) 26.0-26.9, adult: Secondary | ICD-10-CM

## 2020-08-31 DIAGNOSIS — Z0000 Encounter for general adult medical examination without abnormal findings: Secondary | ICD-10-CM | POA: Diagnosis not present

## 2020-08-31 DIAGNOSIS — F3162 Bipolar disorder, current episode mixed, moderate: Secondary | ICD-10-CM | POA: Diagnosis not present

## 2020-08-31 MED ORDER — NICOTINE POLACRILEX 2 MG MT GUM: 2.0000 mg | CHEWING_GUM | OROMUCOSAL | 2 refills | Status: DC | PRN

## 2020-08-31 NOTE — Patient Instructions (Addendum)
Please send a picture of your vaccine card for your covid vaccination history to be updated. You can send this image through MyChart.   Due to your intake of sugary drinks and current medication, we will check blood work. I will notify you of any abnormal results.   Please schedule an appt with me in 1 month.   Steps to Quit Smoking Smoking tobacco is the leading cause of preventable death. It can affect almost every organ in the body. Smoking puts you and people around you at risk for many serious, long-lasting (chronic) diseases. Quitting smoking can be hard, but it is one of the best things that you can do for your health. It is never too late to quit. How do I get ready to quit? When you decide to quit smoking, make a plan to help you succeed. Before you quit:  Pick a date to quit. Set a date within the next 2 weeks to give you time to prepare.  Write down the reasons why you are quitting. Keep this list in places where you will see it often.  Tell your family, friends, and co-workers that you are quitting. Their support is important.  Talk with your doctor about the choices that may help you quit.  Find out if your health insurance will pay for these treatments.  Know the people, places, things, and activities that make you want to smoke (triggers). Avoid them. What first steps can I take to quit smoking?  Throw away all cigarettes at home, at work, and in your car.  Throw away the things that you use when you smoke, such as ashtrays and lighters.  Clean your car. Make sure to empty the ashtray.  Clean your home, including curtains and carpets. What can I do to help me quit smoking? Talk with your doctor about taking medicines and seeing a counselor at the same time. You are more likely to succeed when you do both.  If you are pregnant or breastfeeding, talk with your doctor about counseling or other ways to quit smoking. Do not take medicine to help you quit smoking unless  your doctor tells you to do so. To quit smoking: Quit right away  Quit smoking totally, instead of slowly cutting back on how much you smoke over a period of time.  Go to counseling. You are more likely to quit if you go to counseling sessions regularly. Take medicine You may take medicines to help you quit. Some medicines need a prescription, and some you can buy over-the-counter. Some medicines may contain a drug called nicotine to replace the nicotine in cigarettes. Medicines may:  Help you to stop having the desire to smoke (cravings).  Help to stop the problems that come when you stop smoking (withdrawal symptoms). Your doctor may ask you to use:  Nicotine patches, gum, or lozenges.  Nicotine inhalers or sprays.  Non-nicotine medicine that is taken by mouth. Find resources Find resources and other ways to help you quit smoking and remain smoke-free after you quit. These resources are most helpful when you use them often. They include:  Online chats with a Veterinary surgeon.  Phone quitlines.  Printed Materials engineer.  Support groups or group counseling.  Text messaging programs.  Mobile phone apps. Use apps on your mobile phone or tablet that can help you stick to your quit plan. There are many free apps for mobile phones and tablets as well as websites. Examples include Quit Guide from the [employer] and smokefree.gov  What things can I do to make it easier to quit?  Talk to your family and friends. Ask them to support and encourage you.  Call a phone quitline (1-800-QUIT-NOW), reach out to support groups, or work with a Veterinary surgeon.  Ask people who smoke to not smoke around you.  Avoid places that make you want to smoke, such as: ? Bars. ? Parties. ? Smoke-break areas at work.  Spend time with people who do not smoke.  Lower the stress in your life. Stress can make you want to smoke. Try these things to help your stress: ? Getting regular exercise. ? Doing deep-breathing  exercises. ? Doing yoga. ? Meditating. ? Doing a body scan. To do this, close your eyes, focus on one area of your body at a time from head to toe. Notice which parts of your body are tense. Try to relax the muscles in those areas.   How will I feel when I quit smoking? Day 1 to 3 weeks Within the first 24 hours, you may start to have some problems that come from quitting tobacco. These problems are very bad 2-3 days after you quit, but they do not often last for more than 2-3 weeks. You may get these symptoms:  Mood swings.  Feeling restless, nervous, angry, or annoyed.  Trouble concentrating.  Dizziness.  Strong desire for high-sugar foods and nicotine.  Weight gain.  Trouble pooping (constipation).  Feeling like you may vomit (nausea).  Coughing or a sore throat.  Changes in how the medicines that you take for other issues work in your body.  Depression.  Trouble sleeping (insomnia). Week 3 and afterward After the first 2-3 weeks of quitting, you may start to notice more positive results, such as:  Better sense of smell and taste.  Less coughing and sore throat.  Slower heart rate.  Lower blood pressure.  Clearer skin.  Better breathing.  Fewer sick days. Quitting smoking can be hard. Do not give up if you fail the first time. Some people need to try a few times before they succeed. Do your best to stick to your quit plan, and talk with your doctor if you have any questions or concerns. Summary  Smoking tobacco is the leading cause of preventable death. Quitting smoking can be hard, but it is one of the best things that you can do for your health.  When you decide to quit smoking, make a plan to help you succeed.  Quit smoking right away, not slowly over a period of time.  When you start quitting, seek help from your doctor, family, or friends. This information is not intended to replace advice given to you by your health care provider. Make sure you  discuss any questions you have with your health care provider. Document Revised: 03/11/2019 Document Reviewed: 09/04/2018 Elsevier Patient Education  2021 [employer].

## 2020-09-01 LAB — COMPREHENSIVE METABOLIC PANEL
ALT: 32 IU/L (ref 0–44)
AST: 22 IU/L (ref 0–40)
Albumin/Globulin Ratio: 2.5 — ABNORMAL HIGH (ref 1.2–2.2)
Albumin: 5 g/dL (ref 4.0–5.0)
Alkaline Phosphatase: 90 IU/L (ref 44–121)
BUN/Creatinine Ratio: 11 (ref 9–20)
BUN: 13 mg/dL (ref 6–20)
Bilirubin Total: 0.4 mg/dL (ref 0.0–1.2)
CO2: 21 mmol/L (ref 20–29)
Calcium: 9.8 mg/dL (ref 8.7–10.2)
Chloride: 102 mmol/L (ref 96–106)
Creatinine, Ser: 1.22 mg/dL (ref 0.76–1.27)
Globulin, Total: 2 g/dL (ref 1.5–4.5)
Glucose: 83 mg/dL (ref 65–99)
Potassium: 4.5 mmol/L (ref 3.5–5.2)
Sodium: 141 mmol/L (ref 134–144)
Total Protein: 7 g/dL (ref 6.0–8.5)
eGFR: 81 mL/min/{1.73_m2} (ref >59)

## 2020-09-01 LAB — CBC WITH DIFFERENTIAL/PLATELET
Basophils Absolute: 0.1 10*3/uL (ref 0.0–0.2)
Basos: 2 %
EOS (ABSOLUTE): 0.1 10*3/uL (ref 0.0–0.4)
Eos: 2 %
Hematocrit: 51.2 % — ABNORMAL HIGH (ref 37.5–51.0)
Hemoglobin: 16.6 g/dL (ref 13.0–17.7)
Immature Grans (Abs): 0 10*3/uL (ref 0.0–0.1)
Immature Granulocytes: 0 %
Lymphocytes Absolute: 1.2 10*3/uL (ref 0.7–3.1)
Lymphs: 29 %
MCH: 27 pg (ref 26.6–33.0)
MCHC: 32.4 g/dL (ref 31.5–35.7)
MCV: 83 fL (ref 79–97)
Monocytes Absolute: 0.3 10*3/uL (ref 0.1–0.9)
Monocytes: 8 %
Neutrophils Absolute: 2.4 10*3/uL (ref 1.4–7.0)
Neutrophils: 59 %
Platelets: 192 10*3/uL (ref 150–450)
RBC: 6.15 x10E6/uL — ABNORMAL HIGH (ref 4.14–5.80)
RDW: 13.3 % (ref 11.6–15.4)
WBC: 4.2 10*3/uL (ref 3.4–10.8)

## 2020-09-01 LAB — HEPATITIS C ANTIBODY: Hep C Virus Ab: <0.1 s/co ratio (ref 0.0–0.9)

## 2020-09-02 DIAGNOSIS — Z0000 Encounter for general adult medical examination without abnormal findings: Secondary | ICD-10-CM | POA: Insufficient documentation

## 2020-09-02 NOTE — Assessment & Plan Note (Signed)
Currently smoking 3 cigarettes per day  Patient would like to stop smoking  Will prescribe lozenges and f/u to continue with cessation efforts

## 2020-09-02 NOTE — Assessment & Plan Note (Addendum)
Hep C screening  Will need covid vaccine card to update doses, patient reports three total doses

## 2020-09-02 NOTE — Assessment & Plan Note (Addendum)
Reports adherence and controlled symptoms on Abilify  Follows with [facility]  CBC and CMP

## 2020-09-30 NOTE — Progress Notes (Signed)
    SUBJECTIVE:   CHIEF COMPLAINT / HPI: f/u for smoking cessation   Desire for Smoking Cessation Patient presents for 1 month follow up regarding smoking cessation. Patient reports he continues to smoke 3 cigarettes per day. Patient previously prescribed lozenges and nicotine gum. He reports he continues to smoke 3 cigarettes.  He states that he has not been able to try the nicotine replacement done.  Patient states that he is hesitant to try smoking cessation at this point.  He is unable to identify any specific reasons that he wants to hold off on quitting smoking entirely.  Patient does not identify any particular reasons that smoking is valuable for him.  He denies any coughing, difficulty breathing or chest pain at this time.  HM  Patient offered tetanus vaccine. He has been able to find any record of his second covid vaccination.  Discussed tetanus vaccine.  Patient record reviewed by CMA and does not have any record of a tetanus vaccine.  PERTINENT  PMH / PSH: Bipolar disorder  OBJECTIVE:   BP 116/64   Pulse 86   Wt 212 lb (96.2 kg)   SpO2 98%   BMI 27.22 kg/m   General: male appearing stated age in no acute distress Cardio: Normal S1 and S2, no S3 or S4. Rhythm is regular. No murmurs or rubs.  Bilateral radial pulses palpable Pulm: Clear to auscultation bilaterally, no crackles, wheezing, or diminished breath sounds. Normal respiratory effort, stable on room air Abdomen: Bowel sounds normal. Abdomen soft and non-tender. Extremities: No peripheral edema. Warm/ well perfused.  ASSESSMENT/PLAN:   Tobacco abuse Patient unable to use nicotine to help with smoking cessation.  Continues to smoke 3 cigarettes/day.  Attempted motivational interviewing patient unable to identify any specific triggers or values to smoking.  He would like to wait to try smoking cessation. Recommended patient follow-up with me in 3 months to further discuss Emphasized health benefits of smoking cessation  including heart disease and high blood pressure, patient voiced understanding Offered medication therapy as well as working with Dr. Raymondo Band to help with cessation 34-month follow-up  Healthcare maintenance COVID vaccine 2nd dose reported as 05/16/20 Tetanus vaccine offered today, patient is agreeable      Ronnald Ramp, MD [facility]

## 2020-10-01 ENCOUNTER — Encounter: Payer: Self-pay | Admitting: Family Medicine

## 2020-10-01 ENCOUNTER — Other Ambulatory Visit: Payer: Self-pay

## 2020-10-01 ENCOUNTER — Ambulatory Visit (INDEPENDENT_AMBULATORY_CARE_PROVIDER_SITE_OTHER): Payer: Medicare Other | Admitting: Family Medicine

## 2020-10-01 DIAGNOSIS — Z0000 Encounter for general adult medical examination without abnormal findings: Secondary | ICD-10-CM | POA: Diagnosis not present

## 2020-10-01 DIAGNOSIS — Z720 Tobacco use: Secondary | ICD-10-CM

## 2020-10-01 DIAGNOSIS — F17210 Nicotine dependence, cigarettes, uncomplicated: Secondary | ICD-10-CM | POA: Diagnosis not present

## 2020-10-01 MED ORDER — TETANUS-DIPHTH-ACELL PERTUSSIS 5-2.5-18.5 LF-MCG/0.5 IM SUSY: 0.5000 mL | PREFILLED_SYRINGE | Freq: Once | INTRAMUSCULAR | 0 refills | Status: AC

## 2020-10-01 NOTE — Assessment & Plan Note (Addendum)
COVID vaccine 2nd dose reported as 05/16/20 Tetanus vaccine offered today, patient is agreeable  Rx given for tetanus vaccine

## 2020-10-01 NOTE — Patient Instructions (Addendum)
It was a pleasure to see you today!  Thank you for choosing [facility] for your primary care.   [name] [name] was seen for follow up for smoking cessation.   Our plans for today were:  I recommend that she continue to consider smoking cessation as it is exceptionally beneficial for your overall health.   Our pharmacist, Dr. Raymondo Band, can also work with you in the future if you decided you would like to use medication to help with quitting.   To keep you healthy, please keep in mind the following health maintenance items that you are due for:   1. Tetanus vaccine    You should return to our clinic in 3 months to discuss smoking cessation.   Best Wishes,   Dr. Neita Garnet       Smoking Tobacco Information, Adult Smoking tobacco can be harmful to your health. Tobacco contains a poisonous (toxic), colorless chemical called nicotine. Nicotine is addictive. It changes the brain and can make it hard to stop smoking. Tobacco also has other toxic chemicals that can hurt your body and raise your risk of many cancers. How can smoking tobacco affect me? Smoking tobacco puts you at risk for:  Cancer. Smoking is most commonly associated with lung cancer, but can also lead to cancer in other parts of the body.  Chronic obstructive pulmonary disease (COPD). This is a long-term lung condition that makes it hard to breathe. It also gets worse over time.  High blood pressure (hypertension), heart disease, stroke, or heart attack.  Lung infections, such as pneumonia.  Cataracts. This is when the lenses in the eyes become clouded.  Digestive problems. This may include peptic ulcers, heartburn, and gastroesophageal reflux disease (GERD).  Oral health problems, such as gum disease and tooth loss.  Loss of taste and smell. Smoking can affect your appearance by causing:  Wrinkles.  Yellow or stained teeth, fingers, and fingernails. Smoking tobacco can also affect your  social life, because:  It may be challenging to find places to smoke when away from home. Many workplaces, [employer], hotels, and public places are tobacco-free.  Smoking is expensive. This is due to the cost of tobacco and the long-term costs of treating health problems from smoking.  Secondhand smoke may affect those around you. Secondhand smoke can cause lung cancer, breathing problems, and heart disease. Children of smokers have a higher risk for: ? Sudden infant death syndrome (SIDS). ? Ear infections. ? Lung infections. If you currently smoke tobacco, quitting now can help you:  Lead a longer and healthier life.  Look, smell, breathe, and feel better over time.  Save money.  Protect others from the harms of secondhand smoke. What actions can I take to prevent health problems? Quit smoking  Do not start smoking. Quit if you already do.  Make a plan to quit smoking and commit to it. Look for programs to help you and ask your health care provider for recommendations and ideas.  Set a date and write down all the reasons you want to quit.  Let your friends and family know you are quitting so they can help and support you. Consider finding friends who also want to quit. It can be easier to quit with someone else, so that you can support each other.  Talk with your health care provider about using nicotine replacement medicines to help you quit, such as gum, lozenges, patches, sprays, or pills.  Do not replace cigarette smoking with electronic cigarettes, which  are commonly called e-cigarettes. The safety of e-cigarettes is not known, and some may contain harmful chemicals.  If you try to quit but return to smoking, stay positive. It is common to slip up when you first quit, so take it one day at a time.  Be prepared for cravings. When you feel the urge to smoke, chew gum or suck on hard candy.   Lifestyle  Stay busy and take care of your body.  Drink enough fluid to keep  your urine pale yellow.  Get plenty of exercise and eat a healthy diet. This can help prevent weight gain after quitting.  Monitor your eating habits. Quitting smoking can cause you to have a larger appetite than when you smoke.  Find ways to relax. Go out with friends or family to a movie or a restaurant where people do not smoke.  Ask your health care provider about having regular tests (screenings) to check for cancer. This may include blood tests, imaging tests, and other tests.  Find ways to manage your stress, such as meditation, yoga, or exercise. Where to find support To get support to quit smoking, consider:  Asking your health care provider for more information and resources.  Taking classes to learn more about quitting smoking.  Looking for local organizations that offer resources about quitting smoking.  Joining a support group for people who want to quit smoking in your local community.  Calling the smokefree.gov counselor helpline: 1-800-Quit-Now 226-170-1842) Where to find more information You may find more information about quitting smoking from:  HelpGuide.org: www.helpguide.org  [employer]: smokefree.gov  American Lung Association: www.lung.org Contact a health care provider if you:  Have problems breathing.  Notice that your lips, nose, or fingers turn blue.  Have chest pain.  Are coughing up blood.  Feel faint or you pass out.  Have other health changes that cause you to worry. Summary  Smoking tobacco can negatively affect your health, the health of those around you, your finances, and your social life.  Do not start smoking. Quit if you already do. If you need help quitting, ask your health care provider.  Think about joining a support group for people who want to quit smoking in your local community. There are many effective programs that will help you to quit this behavior. This information is not intended to replace advice given to you  by your health care provider. Make sure you discuss any questions you have with your health care provider. Document Revised: 03/11/2019 Document Reviewed: 07/01/2016 Elsevier Patient Education  2021 [employer].

## 2020-10-01 NOTE — Assessment & Plan Note (Signed)
Patient unable to use nicotine to help with smoking cessation.  Continues to smoke 3 cigarettes/day.  Attempted motivational interviewing patient unable to identify any specific triggers or values to smoking.  He would like to wait to try smoking cessation. Recommended patient follow-up with me in 3 months to further discuss Emphasized health benefits of smoking cessation including heart disease and high blood pressure, patient voiced understanding Offered medication therapy as well as working with Dr. Raymondo Band to help with cessation 63-month follow-up

## 2020-11-21 DIAGNOSIS — F250 Schizoaffective disorder, bipolar type: Secondary | ICD-10-CM | POA: Diagnosis not present

## 2020-11-21 DIAGNOSIS — F419 Anxiety disorder, unspecified: Secondary | ICD-10-CM | POA: Diagnosis not present

## 2020-11-27 ENCOUNTER — Ambulatory Visit: Payer: Medicare Other | Attending: Critical Care Medicine

## 2020-11-27 DIAGNOSIS — Z20822 Contact with and (suspected) exposure to covid-19: Secondary | ICD-10-CM | POA: Diagnosis not present

## 2020-11-28 LAB — NOVEL CORONAVIRUS, NAA: SARS-CoV-2, NAA: NOT DETECTED

## 2020-11-28 LAB — SARS-COV-2, NAA 2 DAY TAT

## 2020-12-24 ENCOUNTER — Ambulatory Visit (INDEPENDENT_AMBULATORY_CARE_PROVIDER_SITE_OTHER): Payer: Medicare Other

## 2020-12-24 ENCOUNTER — Other Ambulatory Visit: Payer: Self-pay

## 2020-12-24 DIAGNOSIS — Z111 Encounter for screening for respiratory tuberculosis: Secondary | ICD-10-CM

## 2020-12-24 NOTE — Progress Notes (Signed)
Patient is here for a PPD placement.   PPD placed in left forearm @ 940am am.   Patient will return 12/26/2020/ to have PPD read. Reminder card given.

## 2020-12-26 ENCOUNTER — Other Ambulatory Visit: Payer: Self-pay

## 2020-12-26 ENCOUNTER — Ambulatory Visit (INDEPENDENT_AMBULATORY_CARE_PROVIDER_SITE_OTHER): Payer: Medicare Other

## 2020-12-26 DIAGNOSIS — Z111 Encounter for screening for respiratory tuberculosis: Secondary | ICD-10-CM

## 2020-12-26 LAB — TB SKIN TEST
Induration: 0 mm
TB Skin Test: NEGATIVE

## 2020-12-26 NOTE — Progress Notes (Signed)
Patient is here for a PPD read.  It was placed on 12/24/2020 in the left forearm @ 0940 am.  PPD RESULTS:  Result: negative Induration: 0 mm  Letter created and given to patient for documentation purposes. Veronda Prude, RN

## 2021-02-05 ENCOUNTER — Ambulatory Visit (INDEPENDENT_AMBULATORY_CARE_PROVIDER_SITE_OTHER): Payer: Medicare Other | Admitting: Family Medicine

## 2021-02-05 ENCOUNTER — Other Ambulatory Visit: Payer: Self-pay

## 2021-02-05 VITALS — BP 129/77 | HR 94 | Temp 99.0°F | Ht 74.0 in

## 2021-02-05 DIAGNOSIS — U071 COVID-19: Secondary | ICD-10-CM | POA: Diagnosis not present

## 2021-02-05 NOTE — Progress Notes (Signed)
    SUBJECTIVE:   CHIEF COMPLAINT / HPI:   Headache/COVID-19 positive: 30 year old male presenting with the above. Has had diarrhea, headaches, fatigue, myalgias, chills. Subjective fevers. No throwing up. Live with uncle and aunt who are positive for Covid19. His symptoms started Sunday evening and tested positive Monday (yesterday).   PERTINENT  PMH / PSH: None relevant  OBJECTIVE:   BP 129/77   Pulse 94   Temp 99 F (37.2 C) (Oral)   Ht 6\' 2"  (1.88 m)   SpO2 100%   BMI 27.22 kg/m    General: NAD, pleasant, able to participate in exam HEENT: No pharyngeal erythema, moist mucous membranes Cardiac: RRR, no murmurs. Respiratory: CTAB, normal effort, No wheezes, rales or rhonchi Abdomen: Bowel sounds present, nontender Skin: warm and dry, no rashes noted  ASSESSMENT/PLAN:   COVID-19: Assessment: 30 y.o. male with symptoms consistent with COVID-19 and a positive COVID-19 test at home.  He is currently residing with 2 family members who also have COVID-19.  He does not have any concerning symptoms to warrant hospitalization at this time.  He is not having shortness of breath.  He -Discussed CDC guidelines for quarantine/isolation -Recommended Tylenol/ibuprofen for myalgias and for chills/fevers -Discussed return precautions -Follow-up as needed.  32, DO [facility]

## 2021-02-05 NOTE — Patient Instructions (Addendum)
It was great to see you! Thank you for allowing me to participate in your care!  Our plans for today:   Per CDC guidelines: Stay home for at least 5 days from the start of your symptoms Stay home for 5 days and isolate from others in your home. Wear a well-fitting mask if you must be around others in your home. Do not travel. End isolation after 5 full days if you are fever-free for 24 hours (without the use of fever-reducingmedication) and your symptoms are improving. If you got very sick from COVID-19 or have a weakened immune system You should isolate for at least 10 days. Consult your doctor before ending isolation.  After you have ended your quarantine, after 5 days: Take precautions until day 10 Wear a well-fitting mask  Wear a well-fitting mask for 10 full days any time you are around others inside your home or in public.Do not go to places where you are unable to wear a mask.  If you develop any trouble breathing, shortness of breath, chest pains, vomiting to the extent that you cannot keep down fluids please go to the emergency department.  You can take Tylenol and ibuprofen to help with your body aches and discomfort.  Please space these out by at least 6 hours.  1 recommended format is to take a Tylenol, wait 3 hours, taking ibuprofen, wait 3 hours, and repeat

## 2021-02-13 ENCOUNTER — Telehealth (INDEPENDENT_AMBULATORY_CARE_PROVIDER_SITE_OTHER): Payer: Medicare Other | Admitting: Family Medicine

## 2021-02-13 DIAGNOSIS — U071 COVID-19: Secondary | ICD-10-CM | POA: Diagnosis not present

## 2021-02-13 MED ORDER — BENZONATATE 100 MG PO CAPS: 100.0000 mg | ORAL_CAPSULE | Freq: Three times a day (TID) | ORAL | 0 refills | Status: AC | PRN

## 2021-02-13 NOTE — Assessment & Plan Note (Signed)
Patient tested positive for COVID-19 approximately 9 days ago.  Complaining of persistent cough with congestion.  Taking Tylenol for discomfort but no cough medications.  Discussed over-the-counter cough medications and other supportive measures as well as prescribed Tessalon Perles.  Provided strict ED and return precautions and patient was agreeable to this.  No further questions or concerns.

## 2021-02-13 NOTE — Progress Notes (Signed)
[facility] Telemedicine Visit  Patient consented to have virtual visit and was identified by name and date of birth. Method of visit: Video  Encounter participants: Patient: [name] [name] - located at home Provider: Derrel Nip - located at home   Chief Complaint: Cough and congestion   HPI: Patient reports that he tested COVID-positive approximately 9 days ago.  Initially had a terrible headache along with cough and congestion but the headaches have resolved.  He continues to have cough with sputum production.  He had a subjective fever approximately 4 days ago when he "felt warm" but otherwise has been afebrile.  He is taking Tylenol for the discomfort but taking no other medications including over-the-counter cough syrups.  He does report he has pain in his throat and chest when coughing but denies any shortness of breath or persistent chest pain.   ROS: per HPI  Pertinent PMHx: None   Exam:  There were no vitals taken for this visit.  Respiratory: Speaking in clear sentences, no shortness of breath noted  Assessment/Plan:  COVID-19 Patient tested positive for COVID-19 approximately 9 days ago.  Complaining of persistent cough with congestion.  Taking Tylenol for discomfort but no cough medications.  Discussed over-the-counter cough medications and other supportive measures as well as prescribed Tessalon Perles.  Provided strict ED and return precautions and patient was agreeable to this.  No further questions or concerns.    Time spent during visit with patient: 13 minutes

## 2021-03-01 DIAGNOSIS — F250 Schizoaffective disorder, bipolar type: Secondary | ICD-10-CM | POA: Diagnosis not present

## 2021-03-01 DIAGNOSIS — F419 Anxiety disorder, unspecified: Secondary | ICD-10-CM | POA: Diagnosis not present

## 2021-05-01 ENCOUNTER — Other Ambulatory Visit: Payer: Self-pay

## 2021-05-01 ENCOUNTER — Ambulatory Visit (INDEPENDENT_AMBULATORY_CARE_PROVIDER_SITE_OTHER): Payer: Medicare Other

## 2021-05-01 DIAGNOSIS — Z23 Encounter for immunization: Secondary | ICD-10-CM | POA: Diagnosis not present

## 2021-05-02 DIAGNOSIS — F250 Schizoaffective disorder, bipolar type: Secondary | ICD-10-CM | POA: Diagnosis not present

## 2021-12-03 ENCOUNTER — Encounter: Payer: Self-pay | Admitting: *Deleted

## 2022-05-12 ENCOUNTER — Ambulatory Visit (INDEPENDENT_AMBULATORY_CARE_PROVIDER_SITE_OTHER): Payer: Medicare Other

## 2022-05-12 DIAGNOSIS — Z23 Encounter for immunization: Secondary | ICD-10-CM

## 2022-05-13 NOTE — Progress Notes (Signed)
Patient presents to nurse clinic for flu and COVID vaccinations. See immunization flowsheet.   Patient observed for 15 minutes post COVID injection. No adverse reaction noted.   Veronda Prude, RN

## 2023-05-26 ENCOUNTER — Ambulatory Visit: Payer: Medicare Other

## 2023-05-26 DIAGNOSIS — Z23 Encounter for immunization: Secondary | ICD-10-CM

## 2023-05-26 NOTE — Progress Notes (Signed)
Patients presents to nurse clinic for Flu vaccine.  Vaccine administered without complication. See admin for details.

## 2023-12-08 ENCOUNTER — Encounter: Payer: Self-pay | Admitting: *Deleted
